# Patient Record
Sex: Female | Born: 1940 | Hispanic: No | Smoking: Former smoker
Health system: Southern US, Community
[De-identification: ages and names within clinical notes are randomized; demographics above are authoritative.]

## PROBLEM LIST (undated history)

## (undated) DIAGNOSIS — J45909 Unspecified asthma, uncomplicated: Secondary | ICD-10-CM

## (undated) DIAGNOSIS — J449 Chronic obstructive pulmonary disease, unspecified: Secondary | ICD-10-CM

## (undated) DIAGNOSIS — E119 Type 2 diabetes mellitus without complications: Secondary | ICD-10-CM

## (undated) HISTORY — DX: Unspecified asthma, uncomplicated: J45.909

## (undated) HISTORY — PX: CHOLECYSTECTOMY: SHX55

## (undated) HISTORY — PX: ABDOMINAL HYSTERECTOMY: SHX81

---

## 2013-01-11 ENCOUNTER — Encounter (HOSPITAL_COMMUNITY): Payer: Self-pay | Admitting: Emergency Medicine

## 2013-01-11 DIAGNOSIS — Z7982 Long term (current) use of aspirin: Secondary | ICD-10-CM

## 2013-01-11 DIAGNOSIS — A0472 Enterocolitis due to Clostridium difficile, not specified as recurrent: Secondary | ICD-10-CM | POA: Diagnosis not present

## 2013-01-11 DIAGNOSIS — J4489 Other specified chronic obstructive pulmonary disease: Secondary | ICD-10-CM | POA: Diagnosis present

## 2013-01-11 DIAGNOSIS — E119 Type 2 diabetes mellitus without complications: Secondary | ICD-10-CM | POA: Diagnosis present

## 2013-01-11 DIAGNOSIS — K420 Umbilical hernia with obstruction, without gangrene: Principal | ICD-10-CM | POA: Diagnosis present

## 2013-01-11 DIAGNOSIS — Z79899 Other long term (current) drug therapy: Secondary | ICD-10-CM

## 2013-01-11 DIAGNOSIS — J449 Chronic obstructive pulmonary disease, unspecified: Secondary | ICD-10-CM | POA: Diagnosis present

## 2013-01-11 DIAGNOSIS — E860 Dehydration: Secondary | ICD-10-CM | POA: Diagnosis present

## 2013-01-11 DIAGNOSIS — Z9089 Acquired absence of other organs: Secondary | ICD-10-CM

## 2013-01-11 DIAGNOSIS — K560 Paralytic ileus: Secondary | ICD-10-CM | POA: Diagnosis not present

## 2013-01-11 DIAGNOSIS — Z87891 Personal history of nicotine dependence: Secondary | ICD-10-CM

## 2013-01-11 DIAGNOSIS — I10 Essential (primary) hypertension: Secondary | ICD-10-CM | POA: Diagnosis present

## 2013-01-11 LAB — LIPASE, BLOOD: Lipase: 16 U/L (ref 11–59)

## 2013-01-11 LAB — COMPREHENSIVE METABOLIC PANEL
Albumin: 4.2 g/dL (ref 3.5–5.2)
Alkaline Phosphatase: 117 U/L (ref 39–117)
BUN: 35 mg/dL — ABNORMAL HIGH (ref 6–23)
CO2: 24 mEq/L (ref 19–32)
Chloride: 95 mEq/L — ABNORMAL LOW (ref 96–112)
GFR calc Af Amer: >90 mL/min (ref >90)
Glucose, Bld: 191 mg/dL — ABNORMAL HIGH (ref 70–99)
Potassium: 4.7 mEq/L (ref 3.7–5.3)
Total Bilirubin: 0.7 mg/dL (ref 0.3–1.2)

## 2013-01-11 LAB — CBC WITH DIFFERENTIAL/PLATELET
Hemoglobin: 15.5 g/dL — ABNORMAL HIGH (ref 12.0–15.0)
Lymphocytes Relative: 6 % — ABNORMAL LOW (ref 12–46)
Lymphs Abs: 1.3 10*3/uL (ref 0.7–4.0)
Monocytes Relative: 7 % (ref 3–12)
Neutro Abs: 18.6 10*3/uL — ABNORMAL HIGH (ref 1.7–7.7)
Neutrophils Relative %: 87 % — ABNORMAL HIGH (ref 43–77)
RBC: 5.5 MIL/uL — ABNORMAL HIGH (ref 3.87–5.11)
WBC: 21.4 10*3/uL — ABNORMAL HIGH (ref 4.0–10.5)

## 2013-01-11 MED ORDER — OXYCODONE-ACETAMINOPHEN 5-325 MG PO TABS
1.0000 | ORAL_TABLET | Freq: Once | ORAL | Status: AC
  Administered 2013-01-11: 1 via ORAL
  Filled 2013-01-11: qty 1

## 2013-01-11 MED ORDER — ONDANSETRON 4 MG PO TBDP
8.0000 mg | ORAL_TABLET | Freq: Once | ORAL | Status: AC
  Administered 2013-01-11: 8 mg via ORAL
  Filled 2013-01-11: qty 2

## 2013-01-11 NOTE — ED Notes (Signed)
Pt states she has been having ABD pain since Monday.  Pt states she has tried OTC medications without success.

## 2013-01-12 ENCOUNTER — Inpatient Hospital Stay (HOSPITAL_COMMUNITY)
Admission: EM | Admit: 2013-01-12 | Discharge: 2013-01-21 | DRG: 354 | Disposition: A | Discharge status: Home / Self Care | Payer: Medicare Other | Attending: Surgery | Admitting: Surgery

## 2013-01-12 ENCOUNTER — Emergency Department (HOSPITAL_COMMUNITY): Payer: Medicare Other

## 2013-01-12 ENCOUNTER — Encounter (HOSPITAL_COMMUNITY): Payer: Self-pay | Admitting: Radiology

## 2013-01-12 DIAGNOSIS — R197 Diarrhea, unspecified: Secondary | ICD-10-CM

## 2013-01-12 DIAGNOSIS — I10 Essential (primary) hypertension: Secondary | ICD-10-CM

## 2013-01-12 DIAGNOSIS — K420 Umbilical hernia with obstruction, without gangrene: Secondary | ICD-10-CM | POA: Diagnosis present

## 2013-01-12 DIAGNOSIS — J4489 Other specified chronic obstructive pulmonary disease: Secondary | ICD-10-CM | POA: Diagnosis present

## 2013-01-12 DIAGNOSIS — A0472 Enterocolitis due to Clostridium difficile, not specified as recurrent: Secondary | ICD-10-CM | POA: Diagnosis present

## 2013-01-12 DIAGNOSIS — J449 Chronic obstructive pulmonary disease, unspecified: Secondary | ICD-10-CM

## 2013-01-12 DIAGNOSIS — E119 Type 2 diabetes mellitus without complications: Secondary | ICD-10-CM | POA: Diagnosis present

## 2013-01-12 HISTORY — DX: Type 2 diabetes mellitus without complications: E11.9

## 2013-01-12 HISTORY — DX: Chronic obstructive pulmonary disease, unspecified: J44.9

## 2013-01-12 LAB — URINALYSIS, ROUTINE W REFLEX MICROSCOPIC
Ketones, ur: 15 mg/dL — AB
Nitrite: NEGATIVE
Specific Gravity, Urine: 1.015 (ref 1.005–1.030)
pH: 5 (ref 5.0–8.0)

## 2013-01-12 LAB — CBC
HCT: 44.6 % (ref 36.0–46.0)
Hemoglobin: 14.5 g/dL (ref 12.0–15.0)
MCH: 27.8 pg (ref 26.0–34.0)
MCHC: 32.5 g/dL (ref 30.0–36.0)
MCV: 85.4 fL (ref 78.0–100.0)

## 2013-01-12 LAB — BASIC METABOLIC PANEL
BUN: 35 mg/dL — ABNORMAL HIGH (ref 6–23)
CO2: 26 mEq/L (ref 19–32)
Chloride: 97 mEq/L (ref 96–112)
Creatinine, Ser: 0.72 mg/dL (ref 0.50–1.10)
GFR calc non Af Amer: 85 mL/min — ABNORMAL LOW (ref >90)
Glucose, Bld: 151 mg/dL — ABNORMAL HIGH (ref 70–99)
Potassium: 4.8 mEq/L (ref 3.7–5.3)

## 2013-01-12 LAB — TSH: TSH: 0.853 u[IU]/mL (ref 0.350–4.500)

## 2013-01-12 LAB — GLUCOSE, CAPILLARY
Glucose-Capillary: 123 mg/dL — ABNORMAL HIGH (ref 70–99)
Glucose-Capillary: 131 mg/dL — ABNORMAL HIGH (ref 70–99)
Glucose-Capillary: 141 mg/dL — ABNORMAL HIGH (ref 70–99)

## 2013-01-12 MED ORDER — LOSARTAN POTASSIUM 25 MG PO TABS: 25.0000 mg | ORAL_TABLET | Freq: Every day | ORAL | Status: DC

## 2013-01-12 MED ORDER — HYDRALAZINE HCL 20 MG/ML IJ SOLN: 10.0000 mg | Freq: Four times a day (QID) | INTRAMUSCULAR | Status: DC | PRN

## 2013-01-12 MED ORDER — SODIUM CHLORIDE 0.9 % IV BOLUS (SEPSIS)
500.0000 mL | Freq: Once | INTRAVENOUS | Status: AC
  Administered 2013-01-12: 500 mL via INTRAVENOUS

## 2013-01-12 MED ORDER — MORPHINE SULFATE 2 MG/ML IJ SOLN
2.0000 mg | Freq: Once | INTRAMUSCULAR | Status: AC
  Administered 2013-01-12: 2 mg via INTRAVENOUS
  Filled 2013-01-12: qty 1

## 2013-01-12 MED ORDER — ALBUTEROL SULFATE HFA 108 (90 BASE) MCG/ACT IN AERS
2.0000 | INHALATION_SPRAY | Freq: Four times a day (QID) | RESPIRATORY_TRACT | Status: DC | PRN
  Filled 2013-01-12: qty 6.7

## 2013-01-12 MED ORDER — PANTOPRAZOLE SODIUM 40 MG PO TBEC: 40.0000 mg | DELAYED_RELEASE_TABLET | Freq: Every day | ORAL | Status: DC

## 2013-01-12 MED ORDER — ONDANSETRON HCL 4 MG/2ML IJ SOLN
4.0000 mg | Freq: Four times a day (QID) | INTRAMUSCULAR | Status: DC | PRN
  Administered 2013-01-17 – 2013-01-21 (×8): 4 mg via INTRAVENOUS
  Filled 2013-01-12 (×8): qty 2

## 2013-01-12 MED ORDER — HYDROMORPHONE HCL PF 1 MG/ML IJ SOLN
0.5000 mg | INTRAMUSCULAR | Status: DC | PRN
  Administered 2013-01-12 (×3): 1 mg via INTRAVENOUS
  Administered 2013-01-13 (×3): 0.5 mg via INTRAVENOUS
  Administered 2013-01-13: 1 mg via INTRAVENOUS
  Administered 2013-01-13: 0.5 mg via INTRAVENOUS
  Administered 2013-01-14 (×4): 1 mg via INTRAVENOUS
  Administered 2013-01-14: 0.5 mg via INTRAVENOUS
  Administered 2013-01-14 – 2013-01-15 (×6): 1 mg via INTRAVENOUS
  Filled 2013-01-12 (×19): qty 1

## 2013-01-12 MED ORDER — INSULIN ASPART 100 UNIT/ML ~~LOC~~ SOLN
0.0000 [IU] | SUBCUTANEOUS | Status: DC
  Administered 2013-01-12: 2 [IU] via SUBCUTANEOUS
  Administered 2013-01-12: 3 [IU] via SUBCUTANEOUS
  Administered 2013-01-12 – 2013-01-20 (×12): 2 [IU] via SUBCUTANEOUS

## 2013-01-12 MED ORDER — SIMVASTATIN 20 MG PO TABS: 20.0000 mg | ORAL_TABLET | Freq: Every day | ORAL | Status: DC

## 2013-01-12 MED ORDER — ENOXAPARIN SODIUM 40 MG/0.4ML ~~LOC~~ SOLN
40.0000 mg | SUBCUTANEOUS | Status: DC
  Administered 2013-01-12 – 2013-01-20 (×9): 40 mg via SUBCUTANEOUS
  Filled 2013-01-12 (×11): qty 0.4

## 2013-01-12 MED ORDER — MONTELUKAST SODIUM 10 MG PO TABS: 10.0000 mg | ORAL_TABLET | Freq: Every day | ORAL | Status: DC

## 2013-01-12 MED ORDER — MOMETASONE FURO-FORMOTEROL FUM 100-5 MCG/ACT IN AERO
2.0000 | INHALATION_SPRAY | Freq: Two times a day (BID) | RESPIRATORY_TRACT | Status: DC
  Administered 2013-01-12 – 2013-01-20 (×16): 2 via RESPIRATORY_TRACT
  Filled 2013-01-12 (×3): qty 8.8

## 2013-01-12 MED ORDER — POTASSIUM CHLORIDE IN NACL 20-0.9 MEQ/L-% IV SOLN
INTRAVENOUS | Status: DC
  Administered 2013-01-12 – 2013-01-17 (×11): via INTRAVENOUS
  Filled 2013-01-12 (×15): qty 1000

## 2013-01-12 MED ORDER — ACETAMINOPHEN 650 MG RE SUPP: 650.0000 mg | Freq: Four times a day (QID) | RECTAL | Status: DC | PRN

## 2013-01-12 MED ORDER — GEMFIBROZIL 600 MG PO TABS: 600.0000 mg | ORAL_TABLET | Freq: Two times a day (BID) | ORAL | Status: DC

## 2013-01-12 MED ORDER — IOHEXOL 300 MG/ML  SOLN
100.0000 mL | Freq: Once | INTRAMUSCULAR | Status: AC | PRN
  Administered 2013-01-12: 100 mL via INTRAVENOUS

## 2013-01-12 MED ORDER — ONDANSETRON HCL 4 MG/2ML IJ SOLN
4.0000 mg | Freq: Once | INTRAMUSCULAR | Status: AC
  Administered 2013-01-12: 4 mg via INTRAVENOUS
  Filled 2013-01-12: qty 2

## 2013-01-12 MED ORDER — CHLORHEXIDINE GLUCONATE 0.12 % MT SOLN
15.0000 mL | Freq: Two times a day (BID) | OROMUCOSAL | Status: DC
  Administered 2013-01-12 – 2013-01-14 (×6): 15 mL via OROMUCOSAL
  Filled 2013-01-12 (×6): qty 15

## 2013-01-12 MED ORDER — IPRATROPIUM-ALBUTEROL 0.5-2.5 (3) MG/3ML IN SOLN: 3.0000 mL | RESPIRATORY_TRACT | Status: DC | PRN

## 2013-01-12 MED ORDER — BIOTENE DRY MOUTH MT LIQD
15.0000 mL | Freq: Two times a day (BID) | OROMUCOSAL | Status: DC
  Administered 2013-01-12 – 2013-01-13 (×3): 15 mL via OROMUCOSAL

## 2013-01-12 MED ORDER — PANTOPRAZOLE SODIUM 40 MG IV SOLR
40.0000 mg | Freq: Every day | INTRAVENOUS | Status: DC
  Administered 2013-01-12 – 2013-01-17 (×6): 40 mg via INTRAVENOUS
  Filled 2013-01-12 (×8): qty 40

## 2013-01-12 MED ORDER — ACETAMINOPHEN 325 MG PO TABS: 650.0000 mg | ORAL_TABLET | Freq: Four times a day (QID) | ORAL | Status: DC | PRN

## 2013-01-12 MED ORDER — IOHEXOL 300 MG/ML  SOLN
25.0000 mL | Freq: Once | INTRAMUSCULAR | Status: AC | PRN
  Administered 2013-01-12: 25 mL via ORAL

## 2013-01-12 MED ORDER — INSULIN ASPART 100 UNIT/ML ~~LOC~~ SOLN: 0.0000 [IU] | SUBCUTANEOUS | Status: DC

## 2013-01-12 NOTE — ED Notes (Signed)
Pt stated she has been at home trying to fight "this" off for a couple days but could no longer take the pain.  "for me to come to the ER it's bad."

## 2013-01-12 NOTE — ED Notes (Signed)
Returned from CT scan.  IV continues to infuse via pump.  Pt left side laying, reports she is comfortable.

## 2013-01-12 NOTE — Consult Note (Signed)
[facility] Medical Consultation  Tonya Houston ZOX:[id number] DOB: May 05, 1940 DOA: 01/12/2013 PCP: Dan Maker, MD   Requesting physician: Dr. Lindie Spruce Date of consultation: 01/12/13 Reason for consultation: Pre-op clearance, diabetes, copd managment  Impression/Recommendations Active Problems:   Incarcerated umbilical hernia   HTN (hypertension)   Chronic airway obstruction, not elsewhere classified   Type II or unspecified type diabetes mellitus without mention of complication, not stated as uncontrolled    1. Diabetes 1. Taking metformin at home, will hold this while inpatient and continue on SSI alone with plans to resume metformin on d/c if stable 2. A1c of 6.3 suggests good glycemic control 3. Currently NPO, would recommend diabetic diet when able to eat 2. HTN 1. BP stable and well controlled 2. Family is unsure of what meds pt is taking 3. Will cont on PRN hydralazine for now 4. Family will bring in pill bottles from home 3. COPD 1. On advair 250/50 bid, will resume 2. Lungs are clear with no wheezing 3. CXR reviewed. Clear and unimpressive 4. Will add PRN duonebs 4. Incarcerated hernia 1. Will defer management to primary surgical service 2. Pre-op EKG was ordered and is still pending 3. No chest pain. No evidence of volume overload. Blood work reviewed and is unremarkable.. 4. If EKG is unremarkable, benefits to surgery would outweigh risks, therefore patient would then be medically cleared for surgery.  I will followup again tomorrow. Please contact me if I can be of assistance in the meanwhile. Thank you for this consultation.  Chief Complaint: Abdominal pain  HPI:  72yo female with a hx of well controlled diabetes, htn, and copd who presents with worsening abdominal pain. She was found to have an incarcerated hernia and subsequently admitted to the surgical service for likely surgical intervention. The hospitalist service was consulted for medical  management of the above as well as medical clearance prior to surgery. Currently, pt denies chest pain or SOB. Denies LE swelling. Only complains of abd pain.  Review of Systems:  Per above, the remainder of the 10pt ros reviewed and are neg.  Past Medical History  Diagnosis Date  . Diabetes mellitus without complication   . COPD (chronic obstructive pulmonary disease)    Past Surgical History  Procedure Laterality Date  . Abdominal hysterectomy    . Cholecystectomy     Social History:  reports that she quit smoking about 21 years ago. She does not have any smokeless tobacco history on file. She reports that she does not drink alcohol or use illicit drugs.  No Known Allergies History reviewed. No pertinent family history.  Prior to Admission medications   Medication Sig Start Date End Date Taking? Authorizing Provider  aspirin EC 81 MG tablet Take 81 mg by mouth daily.   Yes Historical Provider, MD  CALCIUM PO Take 1 tablet by mouth daily.   Yes Historical Provider, MD  IRON PO Take 1 tablet by mouth daily.   Yes Historical Provider, MD   Physical Exam: Blood pressure 116/57, pulse 117, temperature 98.2 F (36.8 C), temperature source Oral, resp. rate 18, height 5' 2.5" (1.588 m), weight 73.483 kg (162 lb), SpO2 83.00%. Filed Vitals:   01/12/13 0445 01/12/13 0515 01/12/13 0530 01/12/13 0829  BP: 142/81 119/65 116/57   Pulse: 120 117 117   Temp:      TempSrc:      Resp:      Height:    5' 2.5" (1.588 m)  Weight:    73.483 kg (162  lb)  SpO2: 95% 79% 83%      General:  Awake, in nad  Eyes: PERRL B  ENT: Membranes moist, dentition fair  Neck: trachea midline, neck supple  Cardiovascular: regular, s1, s2  Respiratory:  Normal resp effort, no wheezing or crackles  Abdomen: soft, nondistended, mildly tender  Skin: normal skin turgor,no abnormal skin lesions seen  Musculoskeletal: perfused, no clubbing  Psychiatric: mood/affect normal // no auditory/visual  hallucinations  Neurologic: cn2-12 grossly intact, strength/sensation intact  Labs on Admission:  Basic Metabolic Panel:  Recent Labs Lab 01/11/13 2310 01/12/13 0501  NA 138 137  K 4.7 4.8  CL 95* 97  CO2 24 26  GLUCOSE 191* 151*  BUN 35* 35*  CREATININE 0.72 0.72  CALCIUM 10.1 9.1   Liver Function Tests:  Recent Labs Lab 01/11/13 2310  AST 22  ALT 19  ALKPHOS 117  BILITOT 0.7  PROT 8.0  ALBUMIN 4.2    Recent Labs Lab 01/11/13 2310  LIPASE 16   No results found for this basename: AMMONIA,  in the last 168 hours CBC:  Recent Labs Lab 01/11/13 2310 01/12/13 0501  WBC 21.4* 9.8  NEUTROABS 18.6*  --   HGB 15.5* 14.5  HCT 46.5* 44.6  MCV 84.5 85.4  PLT 409* 347   Cardiac Enzymes: No results found for this basename: CKTOTAL, CKMB, CKMBINDEX, TROPONINI,  in the last 168 hours BNP: No components found with this basename: POCBNP,  CBG:  Recent Labs Lab 01/12/13 0742 01/12/13 1143  GLUCAP 142* 123*    Radiological Exams on Admission: Ct Abdomen Pelvis W Contrast  01/12/2013   CLINICAL DATA:  Abdominal pain since Monday.  EXAM: CT ABDOMEN AND PELVIS WITH CONTRAST  TECHNIQUE: Multidetector CT imaging of the abdomen and pelvis was performed using the standard protocol following bolus administration of intravenous contrast.  CONTRAST:  OMNIPAQUE IOHEXOL 300 MG/ML  SOLN  COMPARISON:  None.  FINDINGS: Mild scarring in the anterior right lung. Small esophageal hiatal hernia.  Small amount of free fluid around the liver edge. No focal liver lesion. Surgical absence of the gallbladder. The pancreas, spleen, adrenal glands, kidneys, inferior vena cava, and retroperitoneal lymph nodes are unremarkable. Calcification of the aorta without aneurysm. The stomach is not distended. There is small bowel distention with multiple fluid-filled loops. There is an umbilical hernia which contains a loop of small bowel. The small bowel distal to the hernia is decompressed. The  hernia serves as the site of obstruction. No free air or free fluid in the abdomen.  Pelvis: Decompressed stool-filled colon. Scattered diverticula. No diverticulitis. The appendix is normal. No free or loculated pelvic fluid collections. The uterus is likely surgically absent. No abnormal adnexal masses. No destructive bone lesions.  IMPRESSION: Umbilical hernia containing small bowel with small bowel obstruction proximal to the hernia. Small esophageal hiatal hernia. Small amount of free fluid around the liver edge.   Electronically Signed   By: Burman Nieves M.D.   On: 01/12/2013 02:23   Dg Chest Portable 1 View  01/12/2013   CLINICAL DATA:  Abdominal pain.  EXAM: PORTABLE CHEST - 1 VIEW  COMPARISON:  02/13/2012  FINDINGS: The heart size and mediastinal contours are within normal limits. Both lungs are clear. The visualized skeletal structures are unremarkable. No free air under the hemidiaphragms is demonstrated on semi-erect view.  IMPRESSION: No active disease.   Electronically Signed   By: Burman Nieves M.D.   On: 01/12/2013 01:27   Dg Abd Portable  1v  01/12/2013   CLINICAL DATA:  Abdominal pain.  EXAM: PORTABLE ABDOMEN - 1 VIEW  COMPARISON:  None.  FINDINGS: Limited decubitus view is obtained. The nondependent portion of the right abdomen is not included within the field of view. No free air is demonstrated. However, small amount of free air could be missed off the field of view. There is evidence of mild gaseous distention probably of small bowel with multiple air-fluid levels and string of beads sign. Changes suggest dilated fluid-filled small bowel. Obstruction should be excluded.  IMPRESSION: Abnormal bowel gas pattern with suggestion of fluid-filled distended small bowel. Obstruction should be excluded. No evidence of free air although portions of the abdomen are not included within the field of view for evaluation.   Electronically Signed   By: Burman Nieves M.D.   On: 01/12/2013 01:29     EKG:   Time spent:  [name], [name] [facility] Pager [phone]  If 7PM-7AM, please contact night-coverage [email or web address] Password [email or web address] 01/12/2013, 12:02 PM

## 2013-01-12 NOTE — ED Notes (Signed)
Report to RN on 6N

## 2013-01-12 NOTE — ED Notes (Signed)
O2 sat dropping to 88-89 and remaining there.  Placed pt on 2L O2 via [place].

## 2013-01-12 NOTE — ED Provider Notes (Signed)
CSN: [id number]     Arrival date & time 01/11/13  2245 History   First MD Initiated Contact with Patient 01/12/13 0033     Chief Complaint  Patient presents with  . Abdominal Pain    Patient is a 72 y.o. female presenting with abdominal pain. The history is provided by the patient.  Abdominal Pain Pain location:  Generalized Pain quality: aching and bloating   Pain severity:  Severe Onset quality:  Gradual Duration:  2 days Timing:  Constant Progression:  Worsening Chronicity:  New Relieved by:  Nothing Worsened by:  Movement and palpation Associated symptoms: constipation, nausea and vomiting   Associated symptoms: no chest pain, no diarrhea and no fever   pt reports she has had abd pain for two days with associated nausea/vomitng and constipation She has never had this before She reports it is worsening   Past Medical History  Diagnosis Date  . Diabetes mellitus without complication    Past Surgical History  Procedure Laterality Date  . Abdominal hysterectomy    . Cholecystectomy     No family history on file. History  Substance Use Topics  . Smoking status: Former Smoker    Quit date: 01/14/1992  . Smokeless tobacco: Not on file  . Alcohol Use: No   OB History   Grav Para Term Preterm Abortions TAB SAB Ect Mult Living                 Review of Systems  Constitutional: Negative for fever.  Cardiovascular: Negative for chest pain.  Gastrointestinal: Positive for nausea, vomiting, abdominal pain and constipation. Negative for diarrhea.  All other systems reviewed and are negative.    Allergies  Review of patient's allergies indicates no known allergies.  Home Medications   Current Outpatient Rx  Name  Route  Sig  Dispense  Refill  . aspirin EC 81 MG tablet   Oral   Take 81 mg by mouth daily.         Marland Kitchen CALCIUM PO   Oral   Take 1 tablet by mouth daily.         . IRON PO   Oral   Take 1 tablet by mouth daily.          BP 157/84  Pulse  126  Temp(Src) 97.4 F (36.3 C) (Oral)  Resp 16  Wt 162 lb (73.483 kg)  SpO2 91% Physical Exam CONSTITUTIONAL: Well developed/well nourished, uncomfortable appearing HEAD: Normocephalic/atraumatic EYES: EOMI/PERRL ENMT: Mucous membranes moist NECK: supple no meningeal signs CV: S1/S2 noted, no murmurs/rubs/gallops noted LUNGS: Lungs are clear to auscultation bilaterally, no apparent distress ABDOMEN: soft, decreased BS noted, distended, diffuse tenderness noted, umbilical hernia noted with mild tenderness but most of her tenderness is superior to hernia.   NEURO: Pt is awake/alert, moves all extremitiesx4 EXTREMITIES: pulses normal, full ROM SKIN: warm, color normal PSYCH: no abnormalities of mood noted  ED Course  Procedures (including critical care time) 1:35 AM Suspect bowel obstruction Plain films suggestive of bowel obstruction but no free air Will get Ct imaging 3:17 AM Pt found to have SBO by CT imaging D/w surgeon dr wyatt, will see patient Pt updated on plan  Labs Review Labs Reviewed  CBC WITH DIFFERENTIAL - Abnormal; Notable for the following:    WBC 21.4 (*)    RBC 5.50 (*)    Hemoglobin 15.5 (*)    HCT 46.5 (*)    Platelets 409 (*)    Neutrophils Relative % 87 (*)  Neutro Abs 18.6 (*)    Lymphocytes Relative 6 (*)    Monocytes Absolute 1.5 (*)    All other components within normal limits  COMPREHENSIVE METABOLIC PANEL - Abnormal; Notable for the following:    Chloride 95 (*)    Glucose, Bld 191 (*)    BUN 35 (*)    GFR calc non Af Amer 85 (*)    All other components within normal limits  LIPASE, BLOOD  URINALYSIS, ROUTINE W REFLEX MICROSCOPIC   Imaging Review Dg Chest Portable 1 View  01/12/2013   CLINICAL DATA:  Abdominal pain.  EXAM: PORTABLE CHEST - 1 VIEW  COMPARISON:  02/13/2012  FINDINGS: The heart size and mediastinal contours are within normal limits. Both lungs are clear. The visualized skeletal structures are unremarkable. No free air  under the hemidiaphragms is demonstrated on semi-erect view.  IMPRESSION: No active disease.   Electronically Signed   By: Burman Nieves M.D.   On: 01/12/2013 01:27   Dg Abd Portable 1v  01/12/2013   CLINICAL DATA:  Abdominal pain.  EXAM: PORTABLE ABDOMEN - 1 VIEW  COMPARISON:  None.  FINDINGS: Limited decubitus view is obtained. The nondependent portion of the right abdomen is not included within the field of view. No free air is demonstrated. However, small amount of free air could be missed off the field of view. There is evidence of mild gaseous distention probably of small bowel with multiple air-fluid levels and string of beads sign. Changes suggest dilated fluid-filled small bowel. Obstruction should be excluded.  IMPRESSION: Abnormal bowel gas pattern with suggestion of fluid-filled distended small bowel. Obstruction should be excluded. No evidence of free air although portions of the abdomen are not included within the field of view for evaluation.   Electronically Signed   By: Burman Nieves M.D.   On: 01/12/2013 01:29    EKG Interpretation   None       MDM  No diagnosis found. Nursing notes including past medical history and social history reviewed and considered in documentation xrays reviewed and considered Labs/vital reviewed and considered     Joya Gaskins, MD 01/12/13 [phone]

## 2013-01-12 NOTE — H&P (Signed)
Tonya Houston is an 72 y.o. female.   Chief Complaint: Abdominal pain, nausea and vomiting HPI: Problems began on Saturday, acute onset of abdominal pain followed by profuse nausea and vomiting.  CT scan in the ED showed incarcerated umbilical hernia with proximal SBO, this has happened to the patient before by her report, but no prior recommendations for surgery.  Past Medical History  Diagnosis Date  . Diabetes mellitus without complication     Past Surgical History  Procedure Laterality Date  . Abdominal hysterectomy    . Cholecystectomy      No family history on file. Social History:  reports that she quit smoking about 21 years ago. She does not have any smokeless tobacco history on file. She reports that she does not drink alcohol or use illicit drugs.  Allergies: No Known Allergies   (Not in a hospital admission)  Results for orders placed during the hospital encounter of 01/12/13 (from the past 48 hour(s))  CBC WITH DIFFERENTIAL     Status: Abnormal   Collection Time    01/11/13 11:10 PM      Result Value Range   WBC 21.4 (*) 4.0 - 10.5 K/uL   RBC 5.50 (*) 3.87 - 5.11 MIL/uL   Hemoglobin 15.5 (*) 12.0 - 15.0 g/dL   HCT 16.1 (*) 09.6 - 04.5 %   MCV 84.5  78.0 - 100.0 fL   MCH 28.2  26.0 - 34.0 pg   MCHC 33.3  30.0 - 36.0 g/dL   RDW 40.9  81.1 - 91.4 %   Platelets 409 (*) 150 - 400 K/uL   Neutrophils Relative % 87 (*) 43 - 77 %   Neutro Abs 18.6 (*) 1.7 - 7.7 K/uL   Lymphocytes Relative 6 (*) 12 - 46 %   Lymphs Abs 1.3  0.7 - 4.0 K/uL   Monocytes Relative 7  3 - 12 %   Monocytes Absolute 1.5 (*) 0.1 - 1.0 K/uL   Eosinophils Relative 0  0 - 5 %   Eosinophils Absolute 0.0  0.0 - 0.7 K/uL   Basophils Relative 0  0 - 1 %   Basophils Absolute 0.0  0.0 - 0.1 K/uL  COMPREHENSIVE METABOLIC PANEL     Status: Abnormal   Collection Time    01/11/13 11:10 PM      Result Value Range   Sodium 138  137 - 147 mEq/L   Comment: Please note change in reference range.   Potassium 4.7  3.7 - 5.3 mEq/L   Comment: Please note change in reference range.   Chloride 95 (*) 96 - 112 mEq/L   CO2 24  19 - 32 mEq/L   Glucose, Bld 191 (*) 70 - 99 mg/dL   BUN 35 (*) 6 - 23 mg/dL   Creatinine, Ser 7.82  0.50 - 1.10 mg/dL   Calcium 95.6  8.4 - 21.3 mg/dL   Total Protein 8.0  6.0 - 8.3 g/dL   Albumin 4.2  3.5 - 5.2 g/dL   AST 22  0 - 37 U/L   ALT 19  0 - 35 U/L   Alkaline Phosphatase 117  39 - 117 U/L   Total Bilirubin 0.7  0.3 - 1.2 mg/dL   GFR calc non Af Amer 85 (*) >90 mL/min   GFR calc Af Amer >90  >90 mL/min   Comment: (NOTE)     The eGFR has been calculated using the CKD EPI equation.     This calculation has not  been validated in all clinical situations.     eGFR's persistently <90 mL/min signify possible Chronic Kidney     Disease.  LIPASE, BLOOD     Status: None   Collection Time    01/11/13 11:10 PM      Result Value Range   Lipase 16  11 - 59 U/L  URINALYSIS, ROUTINE W REFLEX MICROSCOPIC     Status: Abnormal   Collection Time    01/12/13  3:03 AM      Result Value Range   Color, Urine AMBER (*) YELLOW   Comment: BIOCHEMICALS MAY BE AFFECTED BY COLOR   APPearance CLEAR  CLEAR   Specific Gravity, Urine 1.015  1.005 - 1.030   pH 5.0  5.0 - 8.0   Glucose, UA NEGATIVE  NEGATIVE mg/dL   Hgb urine dipstick NEGATIVE  NEGATIVE   Bilirubin Urine SMALL (*) NEGATIVE   Ketones, ur 15 (*) NEGATIVE mg/dL   Protein, ur NEGATIVE  NEGATIVE mg/dL   Urobilinogen, UA 0.2  0.0 - 1.0 mg/dL   Nitrite NEGATIVE  NEGATIVE   Leukocytes, UA NEGATIVE  NEGATIVE   Comment: MICROSCOPIC NOT DONE ON URINES WITH NEGATIVE PROTEIN, BLOOD, LEUKOCYTES, NITRITE, OR GLUCOSE <1000 mg/dL.   Ct Abdomen Pelvis W Contrast  01/12/2013   CLINICAL DATA:  Abdominal pain since Monday.  EXAM: CT ABDOMEN AND PELVIS WITH CONTRAST  TECHNIQUE: Multidetector CT imaging of the abdomen and pelvis was performed using the standard protocol following bolus administration of intravenous contrast.   CONTRAST:  OMNIPAQUE IOHEXOL 300 MG/ML  SOLN  COMPARISON:  None.  FINDINGS: Mild scarring in the anterior right lung. Small esophageal hiatal hernia.  Small amount of free fluid around the liver edge. No focal liver lesion. Surgical absence of the gallbladder. The pancreas, spleen, adrenal glands, kidneys, inferior vena cava, and retroperitoneal lymph nodes are unremarkable. Calcification of the aorta without aneurysm. The stomach is not distended. There is small bowel distention with multiple fluid-filled loops. There is an umbilical hernia which contains a loop of small bowel. The small bowel distal to the hernia is decompressed. The hernia serves as the site of obstruction. No free air or free fluid in the abdomen.  Pelvis: Decompressed stool-filled colon. Scattered diverticula. No diverticulitis. The appendix is normal. No free or loculated pelvic fluid collections. The uterus is likely surgically absent. No abnormal adnexal masses. No destructive bone lesions.  IMPRESSION: Umbilical hernia containing small bowel with small bowel obstruction proximal to the hernia. Small esophageal hiatal hernia. Small amount of free fluid around the liver edge.   Electronically Signed   By: Burman Nieves M.D.   On: 01/12/2013 02:23   Dg Chest Portable 1 View  01/12/2013   CLINICAL DATA:  Abdominal pain.  EXAM: PORTABLE CHEST - 1 VIEW  COMPARISON:  02/13/2012  FINDINGS: The heart size and mediastinal contours are within normal limits. Both lungs are clear. The visualized skeletal structures are unremarkable. No free air under the hemidiaphragms is demonstrated on semi-erect view.  IMPRESSION: No active disease.   Electronically Signed   By: Burman Nieves M.D.   On: 01/12/2013 01:27   Dg Abd Portable 1v  01/12/2013   CLINICAL DATA:  Abdominal pain.  EXAM: PORTABLE ABDOMEN - 1 VIEW  COMPARISON:  None.  FINDINGS: Limited decubitus view is obtained. The nondependent portion of the right abdomen is not included  within the field of view. No free air is demonstrated. However, small amount of free air could be missed  off the field of view. There is evidence of mild gaseous distention probably of small bowel with multiple air-fluid levels and string of beads sign. Changes suggest dilated fluid-filled small bowel. Obstruction should be excluded.  IMPRESSION: Abnormal bowel gas pattern with suggestion of fluid-filled distended small bowel. Obstruction should be excluded. No evidence of free air although portions of the abdomen are not included within the field of view for evaluation.   Electronically Signed   By: Burman Nieves M.D.   On: 01/12/2013 01:29    Review of Systems  Constitutional: Negative.   Gastrointestinal: Positive for nausea, vomiting and abdominal pain.  All other systems reviewed and are negative.    Blood pressure 131/69, pulse 106, temperature 98.2 F (36.8 C), temperature source Oral, resp. rate 18, weight 73.483 kg (162 lb), SpO2 98.00%. Physical Exam  Vitals reviewed. Constitutional: She is oriented to person, place, and time. She appears well-nourished.  Obese  HENT:  Head: Normocephalic and atraumatic.  Eyes: Conjunctivae and EOM are normal.  Neck: Normal range of motion. Neck supple.  Cardiovascular: Normal rate, regular rhythm and normal heart sounds.   Respiratory: Effort normal and breath sounds normal.  GI: Soft. Bowel sounds are normal. There is tenderness (periumbilical tenderness until reduced) in the periumbilical area. A hernia is present. Hernia confirmed positive in the ventral area.    Musculoskeletal: Normal range of motion.  Neurological: She is alert and oriented to person, place, and time.  Skin: Skin is warm and dry.  Psychiatric: She has a normal mood and affect. Her behavior is normal. Judgment and thought content normal.     Assessment/Plan Previously incarcerated umbilical hernia--reduced in the ED with minimal difficulty SBO related to  incarecerated hernia Leukocytosis, likely related to demargination from significant dehydration secondary to vomiting and obstruction--no peritonitis  Admit the patient after reduction of the hernia, will likely need surgery to fix hernia and examine the bowel that was incarcerated. NGT to LCS No antibiotics needed at this time, but will need some preoperatively  Tamarra Geiselman O 01/12/2013, 4:55 AM

## 2013-01-13 LAB — GLUCOSE, CAPILLARY
GLUCOSE-CAPILLARY: 117 mg/dL — AB (ref 70–99)
GLUCOSE-CAPILLARY: 86 mg/dL (ref 70–99)
Glucose-Capillary: 128 mg/dL — ABNORMAL HIGH (ref 70–99)
Glucose-Capillary: 101 mg/dL — ABNORMAL HIGH (ref 70–99)
Glucose-Capillary: 96 mg/dL (ref 70–99)

## 2013-01-13 LAB — SURGICAL PCR SCREEN
MRSA, PCR: NEGATIVE
STAPHYLOCOCCUS AUREUS: NEGATIVE

## 2013-01-13 NOTE — Progress Notes (Signed)
[facility] PROGRESS NOTE  Tonya Houston WUJ:[id number] DOB: 1940-02-25 DOA: 01/12/2013 PCP: Dan Maker, MD  Assessment/Plan: 1.Diabetes mellitus -Okay blood glucose control today -Continue sliding scale insulin while patient is n.p.o. 2.HTN (hypertension) -BP controlled this a.m., continue to monitor off meds at this time and further treat accordingly  3.Chronic airway obstruction, not elsewhere classified -Stable, continue bronchodilators  4.Incarcerated umbilical hernia -Per surgery  Code Status: full Family Communication: none at bedside Disposition Plan: per primary    Procedures:  none  Antibiotics:  none  HPI/Subjective: Patient complaining of increased abdominal pain  Objective: Filed Vitals:   01/13/13 0646  BP: 118/44  Pulse: 89  Temp: 98.2 F (36.8 C)  Resp: 16    Intake/Output Summary (Last 24 hours) at 01/13/13 1105 Last data filed at 01/13/13 0600  Gross per 24 hour  Intake   2060 ml  Output    450 ml  Net   1610 ml   Filed Weights   01/11/13 2251 01/12/13 0700 01/12/13 0829  Weight: 73.483 kg (162 lb) 74 kg (163 lb 2.3 oz) 73.483 kg (162 lb)    Exam:  General: alert & oriented x 3 In NAD NG in place Cardiovascular: RRR, nl S1 s2 Respiratory: Clear, no crackles or wheezes Abdomen: soft decreased BS +tenderness  no masses palpable Extremities: No cyanosis and no edema    Data Reviewed: Basic Metabolic Panel:  Recent Labs Lab 01/11/13 2310 01/12/13 0501  NA 138 137  K 4.7 4.8  CL 95* 97  CO2 24 26  GLUCOSE 191* 151*  BUN 35* 35*  CREATININE 0.72 0.72  CALCIUM 10.1 9.1   Liver Function Tests:  Recent Labs Lab 01/11/13 2310  AST 22  ALT 19  ALKPHOS 117  BILITOT 0.7  PROT 8.0  ALBUMIN 4.2    Recent Labs Lab 01/11/13 2310  LIPASE 16   No results found for this basename: AMMONIA,  in the last 168 hours CBC:  Recent Labs Lab 01/11/13 2310 01/12/13 0501  WBC 21.4* 9.8  NEUTROABS 18.6*  --   HGB  15.5* 14.5  HCT 46.5* 44.6  MCV 84.5 85.4  PLT 409* 347   Cardiac Enzymes: No results found for this basename: CKTOTAL, CKMB, CKMBINDEX, TROPONINI,  in the last 168 hours BNP (last 3 results) No results found for this basename: PROBNP,  in the last 8760 hours CBG:  Recent Labs Lab 01/12/13 1606 01/12/13 2019 01/12/13 2329 01/13/13 0332 01/13/13 0720  GLUCAP 156* 131* 141* 117* 128*    Recent Results (from the past 240 hour(s))  SURGICAL PCR SCREEN     Status: None   Collection Time    01/13/13 12:22 AM      Result Value Range Status   MRSA, PCR NEGATIVE  NEGATIVE Final   Staphylococcus aureus NEGATIVE  NEGATIVE Final   Comment:            The Xpert SA Assay (FDA     approved for NASAL specimens     in patients over 72 years of age),     is one component of     a comprehensive surveillance     program.  Test performance has     been validated by [employer] for patients greater     than or equal to 69 year old.     It is not intended     to diagnose infection nor to     guide or monitor treatment.  Studies: Ct Abdomen Pelvis W Contrast  01/12/2013   CLINICAL DATA:  Abdominal pain since Monday.  EXAM: CT ABDOMEN AND PELVIS WITH CONTRAST  TECHNIQUE: Multidetector CT imaging of the abdomen and pelvis was performed using the standard protocol following bolus administration of intravenous contrast.  CONTRAST:  100mL OMNIPAQUE IOHEXOL 300 MG/ML  SOLN  COMPARISON:  None.  FINDINGS: Mild scarring in the anterior right lung. Small esophageal hiatal hernia.  Small amount of free fluid around the liver edge. No focal liver lesion. Surgical absence of the gallbladder. The pancreas, spleen, adrenal glands, kidneys, inferior vena cava, and retroperitoneal lymph nodes are unremarkable. Calcification of the aorta without aneurysm. The stomach is not distended. There is small bowel distention with multiple fluid-filled loops. There is an umbilical hernia which contains a loop of  small bowel. The small bowel distal to the hernia is decompressed. The hernia serves as the site of obstruction. No free air or free fluid in the abdomen.  Pelvis: Decompressed stool-filled colon. Scattered diverticula. No diverticulitis. The appendix is normal. No free or loculated pelvic fluid collections. The uterus is likely surgically absent. No abnormal adnexal masses. No destructive bone lesions.  IMPRESSION: Umbilical hernia containing small bowel with small bowel obstruction proximal to the hernia. Small esophageal hiatal hernia. Small amount of free fluid around the liver edge.   Electronically Signed   By: Burman NievesWilliam  Stevens M.D.   On: 01/12/2013 02:23   Dg Chest Portable 1 View  01/12/2013   CLINICAL DATA:  Abdominal pain.  EXAM: PORTABLE CHEST - 1 VIEW  COMPARISON:  02/13/2012  FINDINGS: The heart size and mediastinal contours are within normal limits. Both lungs are clear. The visualized skeletal structures are unremarkable. No free air under the hemidiaphragms is demonstrated on semi-erect view.  IMPRESSION: No active disease.   Electronically Signed   By: Burman NievesWilliam  Stevens M.D.   On: 01/12/2013 01:27   Dg Abd Portable 1v  01/12/2013   CLINICAL DATA:  Abdominal pain.  EXAM: PORTABLE ABDOMEN - 1 VIEW  COMPARISON:  None.  FINDINGS: Limited decubitus view is obtained. The nondependent portion of the right abdomen is not included within the field of view. No free air is demonstrated. However, small amount of free air could be missed off the field of view. There is evidence of mild gaseous distention probably of small bowel with multiple air-fluid levels and string of beads sign. Changes suggest dilated fluid-filled small bowel. Obstruction should be excluded.  IMPRESSION: Abnormal bowel gas pattern with suggestion of fluid-filled distended small bowel. Obstruction should be excluded. No evidence of free air although portions of the abdomen are not included within the field of view for evaluation.    Electronically Signed   By: Burman NievesWilliam  Stevens M.D.   On: 01/12/2013 01:29    Scheduled Meds: . antiseptic oral rinse  15 mL Mouth Rinse q12n4p  . chlorhexidine  15 mL Mouth Rinse BID  . enoxaparin (LOVENOX) injection  40 mg Subcutaneous Q24H  . insulin aspart  0-15 Units Subcutaneous Q4H  . mometasone-formoterol  2 puff Inhalation BID  . pantoprazole (PROTONIX) IV  40 mg Intravenous QHS   Continuous Infusions: . 0.9 % NaCl with KCl 20 mEq / L 100 mL/hr at 01/13/13 16100623    Active Problems:   Incarcerated umbilical hernia   HTN (hypertension)   Chronic airway obstruction, not elsewhere classified   Type II or unspecified type diabetes mellitus without mention of complication, not stated as uncontrolled    Time  spent: 25    [facility]  [facility] Pager [phone]. If 7PM-7AM, please contact night-coverage at [email or web address], password [facility] 01/13/2013, 11:05 AM  LOS: 1 day

## 2013-01-13 NOTE — Progress Notes (Signed)
General Surgery Note  LOS: 1 day  POD -     Assessment/Plan: 1.  Incarcerated umbilical hernia - now reduced  Has NGT and has had a lot of BM's.  Will plan repair of hernia, but probably not today because of holiday and backed up OR schedule. 2.  Diabetes mellitus  Glucose - 128 - 01/13/2013 3.  HTN 4.  COPD 5.  DVT prophylaxis - Lovenox   Active Problems:   Incarcerated umbilical hernia   HTN (hypertension)   Chronic airway obstruction, not elsewhere classified   Type II or unspecified type diabetes mellitus without mention of complication, not stated as uncontrolled   Subjective:  Doing okay.  No severe pain, but NGT bothering her. Objective:   Filed Vitals:   01/13/13 0646  BP: 118/44  Pulse: 89  Temp: 98.2 F (36.8 C)  Resp: 16     Intake/Output from previous day:  12/31 0701 - 01/01 0700 In: 2060 [I.V.:2060] Out: 450 [Emesis/NG output:450]  Intake/Output this shift:      Physical Exam:   General: [name] who is alert and oriented.  Has NGT.   HEENT: Normal. Pupils equal. .   Lungs: Clear.   Abdomen: Very large, though she says she has a large abdomen.  No localized tenderness.  BS present.  Umbilical hernia reduced.   Lab Results:    Recent Labs  01/11/13 2310 01/12/13 0501  WBC 21.4* 9.8  HGB 15.5* 14.5  HCT 46.5* 44.6  PLT 409* 347    BMET   Recent Labs  01/11/13 2310 01/12/13 0501  NA 138 137  K 4.7 4.8  CL 95* 97  CO2 24 26  GLUCOSE 191* 151*  BUN 35* 35*  CREATININE 0.72 0.72  CALCIUM 10.1 9.1    PT/INR  No results found for this basename: LABPROT, INR,  in the last 72 hours  ABG  No results found for this basename: PHART, PCO2, PO2, HCO3,  in the last 72 hours   Studies/Results:  Ct Abdomen Pelvis W Contrast  01/12/2013   CLINICAL DATA:  Abdominal pain since Monday.  EXAM: CT ABDOMEN AND PELVIS WITH CONTRAST  TECHNIQUE: Multidetector CT imaging of the abdomen and pelvis was performed using the standard protocol following  bolus administration of intravenous contrast.  CONTRAST:  OMNIPAQUE IOHEXOL 300 MG/ML  SOLN  COMPARISON:  None.  FINDINGS: Mild scarring in the anterior right lung. Small esophageal hiatal hernia.  Small amount of free fluid around the liver edge. No focal liver lesion. Surgical absence of the gallbladder. The pancreas, spleen, adrenal glands, kidneys, inferior vena cava, and retroperitoneal lymph nodes are unremarkable. Calcification of the aorta without aneurysm. The stomach is not distended. There is small bowel distention with multiple fluid-filled loops. There is an umbilical hernia which contains a loop of small bowel. The small bowel distal to the hernia is decompressed. The hernia serves as the site of obstruction. No free air or free fluid in the abdomen.  Pelvis: Decompressed stool-filled colon. Scattered diverticula. No diverticulitis. The appendix is normal. No free or loculated pelvic fluid collections. The uterus is likely surgically absent. No abnormal adnexal masses. No destructive bone lesions.  IMPRESSION: Umbilical hernia containing small bowel with small bowel obstruction proximal to the hernia. Small esophageal hiatal hernia. Small amount of free fluid around the liver edge.   Electronically Signed   By: Burman Nieves M.D.   On: 01/12/2013 02:23   Dg Chest Portable 1 View  01/12/2013  CLINICAL DATA:  Abdominal pain.  EXAM: PORTABLE CHEST - 1 VIEW  COMPARISON:  02/13/2012  FINDINGS: The heart size and mediastinal contours are within normal limits. Both lungs are clear. The visualized skeletal structures are unremarkable. No free air under the hemidiaphragms is demonstrated on semi-erect view.  IMPRESSION: No active disease.   Electronically Signed   By: Burman NievesWilliam  Stevens M.D.   On: 01/12/2013 01:27   Dg Abd Portable 1v  01/12/2013   CLINICAL DATA:  Abdominal pain.  EXAM: PORTABLE ABDOMEN - 1 VIEW  COMPARISON:  None.  FINDINGS: Limited decubitus view is obtained. The nondependent  portion of the right abdomen is not included within the field of view. No free air is demonstrated. However, small amount of free air could be missed off the field of view. There is evidence of mild gaseous distention probably of small bowel with multiple air-fluid levels and string of beads sign. Changes suggest dilated fluid-filled small bowel. Obstruction should be excluded.  IMPRESSION: Abnormal bowel gas pattern with suggestion of fluid-filled distended small bowel. Obstruction should be excluded. No evidence of free air although portions of the abdomen are not included within the field of view for evaluation.   Electronically Signed   By: Burman NievesWilliam  Stevens M.D.   On: 01/12/2013 01:29     Anti-infectives:   Anti-infectives   None      Ovidio Kinavid Elizar Alpern, MD, FACS Pager: [phone] [facility] Office: [phone] 01/13/2013

## 2013-01-14 ENCOUNTER — Inpatient Hospital Stay (HOSPITAL_COMMUNITY): Payer: Medicare Other | Admitting: Certified Registered Nurse Anesthetist

## 2013-01-14 ENCOUNTER — Encounter (HOSPITAL_COMMUNITY): Payer: Medicare Other | Admitting: Certified Registered Nurse Anesthetist

## 2013-01-14 ENCOUNTER — Encounter (HOSPITAL_COMMUNITY): Admission: EM | Disposition: A | Discharge status: Home / Self Care | Payer: Self-pay | Source: Home / Self Care

## 2013-01-14 HISTORY — PX: UMBILICAL HERNIA REPAIR: SHX196

## 2013-01-14 LAB — GLUCOSE, CAPILLARY
GLUCOSE-CAPILLARY: 78 mg/dL (ref 70–99)
GLUCOSE-CAPILLARY: 84 mg/dL (ref 70–99)
GLUCOSE-CAPILLARY: 76 mg/dL (ref 70–99)
GLUCOSE-CAPILLARY: 83 mg/dL (ref 70–99)
Glucose-Capillary: 83 mg/dL (ref 70–99)
Glucose-Capillary: 88 mg/dL (ref 70–99)
Glucose-Capillary: 95 mg/dL (ref 70–99)

## 2013-01-14 SURGERY — REPAIR, HERNIA, UMBILICAL, ADULT
Anesthesia: General | Site: Abdomen

## 2013-01-14 SURGERY — Surgical Case
Anesthesia: *Unknown

## 2013-01-14 MED ORDER — LACTATED RINGERS IV SOLN
INTRAVENOUS | Status: DC | PRN
  Administered 2013-01-14 (×2): via INTRAVENOUS

## 2013-01-14 MED ORDER — ROCURONIUM BROMIDE 100 MG/10ML IV SOLN
INTRAVENOUS | Status: DC | PRN
  Administered 2013-01-14: 20 mg via INTRAVENOUS

## 2013-01-14 MED ORDER — MEPERIDINE HCL 25 MG/ML IJ SOLN: 6.2500 mg | INTRAMUSCULAR | Status: DC | PRN

## 2013-01-14 MED ORDER — HYDROMORPHONE HCL PF 1 MG/ML IJ SOLN
0.2500 mg | INTRAMUSCULAR | Status: DC | PRN
  Administered 2013-01-14: 0.5 mg via INTRAVENOUS

## 2013-01-14 MED ORDER — SODIUM CHLORIDE 0.9 % IJ SOLN
INTRAMUSCULAR | Status: AC
  Filled 2013-01-14: qty 3

## 2013-01-14 MED ORDER — LACTATED RINGERS IV SOLN
INTRAVENOUS | Status: DC
  Administered 2013-01-14: 09:00:00 via INTRAVENOUS

## 2013-01-14 MED ORDER — OXYCODONE HCL 5 MG PO TABS: 5.0000 mg | ORAL_TABLET | Freq: Once | ORAL | Status: DC | PRN

## 2013-01-14 MED ORDER — LIDOCAINE HCL (CARDIAC) 20 MG/ML IV SOLN
INTRAVENOUS | Status: DC | PRN
  Administered 2013-01-14: 100 mg via INTRAVENOUS

## 2013-01-14 MED ORDER — BUPIVACAINE-EPINEPHRINE 0.5% -1:200000 IJ SOLN
INTRAMUSCULAR | Status: DC | PRN
  Administered 2013-01-14: 30 mL

## 2013-01-14 MED ORDER — SUCCINYLCHOLINE CHLORIDE 20 MG/ML IJ SOLN
INTRAMUSCULAR | Status: DC | PRN
  Administered 2013-01-14: 120 mg via INTRAVENOUS

## 2013-01-14 MED ORDER — HYDROMORPHONE HCL PF 1 MG/ML IJ SOLN
INTRAMUSCULAR | Status: AC
  Filled 2013-01-14: qty 1

## 2013-01-14 MED ORDER — CEFAZOLIN SODIUM-DEXTROSE 2-3 GM-% IV SOLR
2.0000 g | INTRAVENOUS | Status: AC
  Filled 2013-01-14 (×2): qty 50

## 2013-01-14 MED ORDER — GLYCOPYRROLATE 0.2 MG/ML IJ SOLN
INTRAMUSCULAR | Status: DC | PRN
  Administered 2013-01-14: 0.6 mg via INTRAVENOUS

## 2013-01-14 MED ORDER — BUPIVACAINE-EPINEPHRINE (PF) 0.25% -1:200000 IJ SOLN
INTRAMUSCULAR | Status: AC
  Filled 2013-01-14: qty 30

## 2013-01-14 MED ORDER — FENTANYL CITRATE 0.05 MG/ML IJ SOLN
INTRAMUSCULAR | Status: DC | PRN
  Administered 2013-01-14: 150 ug via INTRAVENOUS

## 2013-01-14 MED ORDER — MIDAZOLAM HCL 5 MG/5ML IJ SOLN
INTRAMUSCULAR | Status: DC | PRN
  Administered 2013-01-14: 2 mg via INTRAVENOUS

## 2013-01-14 MED ORDER — ONDANSETRON HCL 4 MG/2ML IJ SOLN
INTRAMUSCULAR | Status: DC | PRN
  Administered 2013-01-14: 4 mg via INTRAVENOUS

## 2013-01-14 MED ORDER — OXYCODONE HCL 5 MG/5ML PO SOLN: 5.0000 mg | Freq: Once | ORAL | Status: DC | PRN

## 2013-01-14 MED ORDER — PROPOFOL 10 MG/ML IV BOLUS
INTRAVENOUS | Status: DC | PRN
  Administered 2013-01-14: 120 mg via INTRAVENOUS

## 2013-01-14 MED ORDER — 0.9 % SODIUM CHLORIDE (POUR BTL) OPTIME
TOPICAL | Status: DC | PRN
  Administered 2013-01-14: 1000 mL

## 2013-01-14 MED ORDER — ONDANSETRON HCL 4 MG/2ML IJ SOLN: 4.0000 mg | Freq: Once | INTRAMUSCULAR | Status: DC | PRN

## 2013-01-14 MED ORDER — NEOSTIGMINE METHYLSULFATE 1 MG/ML IJ SOLN
INTRAMUSCULAR | Status: DC | PRN
  Administered 2013-01-14: 4 mg via INTRAVENOUS

## 2013-01-14 SURGICAL SUPPLY — 52 items
APL SKNCLS STERI-STRIP NONHPOA (GAUZE/BANDAGES/DRESSINGS) ×1
BENZOIN TINCTURE PRP APPL 2/3 (GAUZE/BANDAGES/DRESSINGS) ×3 IMPLANT
BLADE SURG 15 STRL LF DISP TIS (BLADE) ×1 IMPLANT
BLADE SURG 15 STRL SS (BLADE) ×3
BLADE SURG ROTATE 9660 (MISCELLANEOUS) IMPLANT
CANISTER SUCTION 2500CC (MISCELLANEOUS) IMPLANT
CHLORAPREP W/TINT 26ML (MISCELLANEOUS) ×3 IMPLANT
CLOSURE WOUND 1/2 X4 (GAUZE/BANDAGES/DRESSINGS) ×1
COVER SURGICAL LIGHT HANDLE (MISCELLANEOUS) ×3 IMPLANT
DRAPE PED LAPAROTOMY (DRAPES) ×3 IMPLANT
DRAPE UTILITY 15X26 W/TAPE STR (DRAPE) ×6 IMPLANT
DRSG TEGADERM 4X4.75 (GAUZE/BANDAGES/DRESSINGS) ×2 IMPLANT
ELECT CAUTERY BLADE 6.4 (BLADE) ×3 IMPLANT
ELECT REM PT RETURN 9FT ADLT (ELECTROSURGICAL) ×3
ELECTRODE REM PT RTRN 9FT ADLT (ELECTROSURGICAL) ×1 IMPLANT
GAUZE SPONGE 2X2 8PLY STRL LF (GAUZE/BANDAGES/DRESSINGS) IMPLANT
GAUZE SPONGE 4X4 16PLY XRAY LF (GAUZE/BANDAGES/DRESSINGS) ×3 IMPLANT
GLOVE BIO SURGEON STRL SZ7 (GLOVE) ×9 IMPLANT
GLOVE BIOGEL PI IND STRL 6.5 (GLOVE) ×1 IMPLANT
GLOVE BIOGEL PI IND STRL 7.0 (GLOVE) IMPLANT
GLOVE BIOGEL PI IND STRL 7.5 (GLOVE) ×1 IMPLANT
GLOVE BIOGEL PI INDICATOR 6.5 (GLOVE) ×2
GLOVE BIOGEL PI INDICATOR 7.0 (GLOVE) ×4
GLOVE BIOGEL PI INDICATOR 7.5 (GLOVE) ×4
GLOVE SKINSENSE NS SZ6.5 (GLOVE) ×2
GLOVE SKINSENSE STRL SZ6.5 (GLOVE) IMPLANT
GOWN STRL NON-REIN LRG LVL3 (GOWN DISPOSABLE) ×10 IMPLANT
KIT BASIN OR (CUSTOM PROCEDURE TRAY) ×3 IMPLANT
KIT ROOM TURNOVER OR (KITS) ×3 IMPLANT
NDL HYPO 25GX1X1/2 BEV (NEEDLE) ×1 IMPLANT
NEEDLE HYPO 25GX1X1/2 BEV (NEEDLE) ×3 IMPLANT
NS IRRIG 1000ML POUR BTL (IV SOLUTION) ×3 IMPLANT
PACK SURGICAL SETUP 50X90 (CUSTOM PROCEDURE TRAY) ×3 IMPLANT
PAD ARMBOARD 7.5X6 YLW CONV (MISCELLANEOUS) ×6 IMPLANT
PENCIL BUTTON HOLSTER BLD 10FT (ELECTRODE) ×3 IMPLANT
SPONGE GAUZE 2X2 STER 10/PKG (GAUZE/BANDAGES/DRESSINGS) ×2
SPONGE GAUZE 4X4 12PLY (GAUZE/BANDAGES/DRESSINGS) ×3 IMPLANT
SPONGE LAP 18X18 X RAY DECT (DISPOSABLE) ×3 IMPLANT
STRIP CLOSURE SKIN 1/2X4 (GAUZE/BANDAGES/DRESSINGS) ×2 IMPLANT
SUCTION POOLE TIP (SUCTIONS) ×2 IMPLANT
SUT MNCRL AB 4-0 PS2 18 (SUTURE) ×3 IMPLANT
SUT NOVA NAB GS-21 0 18 T12 DT (SUTURE) ×6 IMPLANT
SUT VIC AB 3-0 SH 27 (SUTURE) ×3
SUT VIC AB 3-0 SH 27X BRD (SUTURE) ×1 IMPLANT
SYR BULB 3OZ (MISCELLANEOUS) ×3 IMPLANT
SYR CONTROL 10ML LL (SYRINGE) ×3 IMPLANT
TOWEL OR 17X24 6PK STRL BLUE (TOWEL DISPOSABLE) ×3 IMPLANT
TOWEL OR 17X26 10 PK STRL BLUE (TOWEL DISPOSABLE) ×3 IMPLANT
TUBE CONNECTING 12'X1/4 (SUCTIONS)
TUBE CONNECTING 12X1/4 (SUCTIONS) IMPLANT
WATER STERILE IRR 1000ML POUR (IV SOLUTION) IMPLANT
YANKAUER SUCT BULB TIP NO VENT (SUCTIONS) ×3 IMPLANT

## 2013-01-14 NOTE — Progress Notes (Signed)
  Subjective: Still slightly sore at umbilicus Having bowel movements - NG out No nausea  Objective: Vital signs in last 24 hours: Temp:  [98.2 F (36.8 C)-98.3 F (36.8 C)] 98.3 F (36.8 C) (01/02 0635) Pulse Rate:  [91-100] 91 (01/02 0635) Resp:  [17-20] 20 (01/02 0635) BP: (103-122)/(49-61) 103/49 mmHg (01/02 0635) SpO2:  [97 %-99 %] 98 % (01/02 0635) Last BM Date: 01/13/13  Intake/Output from previous day: 01/01 0701 - 01/02 0700 In: 1860 [P.O.:360; I.V.:1500] Out: -  Intake/Output this shift:    General appearance: alert, cooperative and no distress Umbilical hernia - reduced  Lab Results:   Recent Labs  01/11/13 2310 01/12/13 0501  WBC 21.4* 9.8  HGB 15.5* 14.5  HCT 46.5* 44.6  PLT 409* 347   BMET  Recent Labs  01/11/13 2310 01/12/13 0501  NA 138 137  K 4.7 4.8  CL 95* 97  CO2 24 26  GLUCOSE 191* 151*  BUN 35* 35*  CREATININE 0.72 0.72  CALCIUM 10.1 9.1   PT/INR No results found for this basename: LABPROT, INR,  in the last 72 hours ABG No results found for this basename: PHART, PCO2, PO2, HCO3,  in the last 72 hours  Studies/Results: No results found.  Anti-infectives: Anti-infectives   None      Assessment/Plan: s/p Procedure(s): OPEN REPAIR UMBILICAL HERNIA  (N/A) Surgery today to repair umbilical hernia The surgical procedure has been discussed with the patient.  Potential risks, benefits, alternative treatments, and expected outcomes have been explained.  All of the patient's questions at this time have been answered.  The likelihood of reaching the patient's treatment goal is good.  The patient understand the proposed surgical procedure and wishes to proceed.   LOS: 2 days    Tonya Houston K. 01/14/2013

## 2013-01-14 NOTE — Transfer of Care (Signed)
Immediate Anesthesia Transfer of Care Note  Patient: Nicky PughJeanette Andreasen  Procedure(s) Performed: Procedure(s): OPEN REPAIR UMBILICAL HERNIA  (N/A)  Patient Location: PACU  Anesthesia Type:General  Level of Consciousness: awake and alert   Airway & Oxygen Therapy: Patient Spontanous Breathing and Patient connected to nasal cannula oxygen  Post-op Assessment: Report given to PACU RN and Post -op Vital signs reviewed and stable  Post vital signs: Reviewed and stable  Complications: No apparent anesthesia complications

## 2013-01-14 NOTE — Op Note (Signed)
Indications:  The patient presented with a history of an incarcerated umbilical hernia with small bowel obstruction.  The patient was examined and we recommended umbilical hernia repair.  The hernia was manually reduced but she remains mildly tender around her umbilicus.  She has had a previous laparoscopic surgery through this area.  Pre-operative diagnosis:  Umbilical hernia  Post-operative diagnosis:  Same  Surgeon: [name]   Assistants: none  Anesthesia: General endotracheal anesthesia  ASA Class: 2   Procedure Details  The patient was seen again in the Holding Room. The risks, benefits, complications, treatment options, and expected outcomes were discussed with the patient. The possibilities of reaction to medication, pulmonary aspiration, perforation of viscus, bleeding, recurrent infection, the need for additional procedures, and development of a complication requiring transfusion or further operation were discussed with the patient and/or family. There was concurrence with the proposed plan, and informed consent was obtained. The site of surgery was properly noted/marked. The patient was taken to the Operating Room, [facility], and the procedure verified as umbilical hernia repair. A Time Out was held and the above information confirmed.  After an adequate level of general anesthesia was obtained, the patient's abdomen was prepped with Chloraprep and draped in sterile fashion.  Her abdomen is quite protuberant, but she had told me that she always appears this way.  We made a transverse incision above the umbilicus.  Dissection was carried down to the hernia sac with cautery.  We dissected bluntly around the hernia sac down to the edge of the fascial defect.  The hernia sac extends down below the umbilicus. We excised the hernia sac back into the fascial edges.  The fascial defect measured 1.5 cm.  We cleared the fascia in all directions.   The fascial defect was  closed with multiple interrupted figure-of-eight 0 Novofil sutures.  The base of the umbilicus was tacked down with 3-0 Vicryl.  3-0 Vicryl was used to close the subcutaneous tissues and 4-0 Monocryl was used to close the skin.  Steri-strips and clean dressing were applied.  The patient was extubated and brought to the recovery room in stable condition.  All sponge, instrument, and needle counts were correct prior to closure and at the conclusion of the case.   Estimated Blood Loss: Minimal          Complications: None; patient tolerated the procedure well.         Disposition: PACU - hemodynamically stable.         Condition: stable  Wilmon ArmsMatthew K. Corliss Skainssuei, MD, [facility]  General/ Trauma Surgery  01/14/2013 9:41 AM

## 2013-01-14 NOTE — Anesthesia Preprocedure Evaluation (Addendum)
Anesthesia Evaluation  Patient identified by MRN, date of birth, ID band Patient awake    Reviewed: Allergy & Precautions, H&P , NPO status , Patient's Chart, lab work & pertinent test results, reviewed documented beta blocker date and time   Airway Mallampati: I TM Distance: >3 FB Neck ROM: Full  Mouth opening: Limited Mouth Opening  Dental  (+) Edentulous Lower and Edentulous Upper   Pulmonary COPD COPD inhaler, former smoker,  breath sounds clear to auscultation        Cardiovascular hypertension, Pt. on medications     Neuro/Psych    GI/Hepatic   Endo/Other  diabetes, Well Controlled, Type 2, Oral Hypoglycemic Agents  Renal/GU      Musculoskeletal   Abdominal   Peds  Hematology   Anesthesia Other Findings   Reproductive/Obstetrics                          Anesthesia Physical Anesthesia Plan  ASA: II  Anesthesia Plan: General   Post-op Pain Management:    Induction: Intravenous  Airway Management Planned: Oral ETT  Additional Equipment:   Intra-op Plan:   Post-operative Plan: Extubation in OR  Informed Consent: I have reviewed the patients History and Physical, chart, labs and discussed the procedure including the risks, benefits and alternatives for the proposed anesthesia with the patient or authorized representative who has indicated his/her understanding and acceptance.   Dental advisory given  Plan Discussed with: CRNA and Surgeon  Anesthesia Plan Comments:        Anesthesia Quick Evaluation

## 2013-01-14 NOTE — Anesthesia Procedure Notes (Signed)
Procedure Name: Intubation Date/Time: 01/14/2013 8:59 AM Performed by: Reine JustFLOWERS, Jeffrey Voth T Pre-anesthesia Checklist: Patient identified, Emergency Drugs available, Suction available, Patient being monitored and Timeout performed Patient Re-evaluated:Patient Re-evaluated prior to inductionOxygen Delivery Method: Circle system utilized and Simple face mask Preoxygenation: Pre-oxygenation with 100% oxygen Intubation Type: IV induction Ventilation: Mask ventilation without difficulty Laryngoscope Size: Miller and 2 Grade View: Grade I Tube type: Oral Tube size: 7.0 mm Number of attempts: 1 Airway Equipment and Method: Patient positioned with wedge pillow and Stylet Placement Confirmation: ETT inserted through vocal cords under direct vision,  positive ETCO2 and breath sounds checked- equal and bilateral Secured at: 22 cm Tube secured with: Tape Dental Injury: Teeth and Oropharynx as per pre-operative assessment

## 2013-01-14 NOTE — Anesthesia Postprocedure Evaluation (Signed)
Anesthesia Post Note  Patient: Tonya PughJeanette Houston  Procedure(s) Performed: Procedure(s) (LRB): OPEN REPAIR UMBILICAL HERNIA  (N/A)  Anesthesia type: general  Patient location: PACU  Post pain: Pain level controlled  Post assessment: Patient's Cardiovascular Status Stable  Last Vitals:  Filed Vitals:   01/14/13 1035  BP: 138/78  Pulse: 88  Temp:   Resp: 17    Post vital signs: Reviewed and stable  Level of consciousness: sedated  Complications: No apparent anesthesia complications

## 2013-01-14 NOTE — Progress Notes (Signed)
[facility] PROGRESS NOTE  Tonya Houston ION:[id number] DOB: 02/02/1940 DOA: 01/12/2013 PCP: Dan MakerR,RICHARD L, MD  Assessment/Plan: 1.Diabetes mellitus -good blood glucose control today -Continue sliding scale insulin while patient follow and resume outpt meds once tolerating by mouth well. 2.HTN (hypertension) -BP controlled this a.m., continue to monitor off meds at this time and further treat accordingly  3.Chronic airway obstruction, not elsewhere classified -Stable, continue bronchodilators  4.Incarcerated umbilical hernia -s/p surgery, per her primary Code Status: full Family Communication: Family at bedside Disposition Plan: per primary    Procedures:  none  Antibiotics:  Cefazolin per surgery  HPI/Subjective: s/p surgery -states she feels better, requesting a diet  Objective: Filed Vitals:   01/14/13 1157  BP: 120/74  Pulse: 79  Temp: 97.5 F (36.4 C)  Resp: 19    Intake/Output Summary (Last 24 hours) at 01/14/13 1250 Last data filed at 01/14/13 0954  Gross per 24 hour  Intake   2860 ml  Output      0 ml  Net   2860 ml   Filed Weights   01/11/13 2251 01/12/13 0700 01/12/13 0829  Weight: 73.483 kg (162 lb) 74 kg (163 lb 2.3 oz) 73.483 kg (162 lb)    Exam:  General: alert & oriented x 3 In NAD NG in place Cardiovascular: RRR, nl S1 s2 Respiratory: Clear, no crackles or wheezes Abdomen: +BS,ND, dressing clean and dry  no masses palpable Extremities: No cyanosis and no edema    Data Reviewed: Basic Metabolic Panel:  Recent Labs Lab 01/11/13 2310 01/12/13 0501  NA 138 137  K 4.7 4.8  CL 95* 97  CO2 24 26  GLUCOSE 191* 151*  BUN 35* 35*  CREATININE 0.72 0.72  CALCIUM 10.1 9.1   Liver Function Tests:  Recent Labs Lab 01/11/13 2310  AST 22  ALT 19  ALKPHOS 117  BILITOT 0.7  PROT 8.0  ALBUMIN 4.2    Recent Labs Lab 01/11/13 2310  LIPASE 16   No results found for this basename: AMMONIA,  in the last 168  hours CBC:  Recent Labs Lab 01/11/13 2310 01/12/13 0501  WBC 21.4* 9.8  NEUTROABS 18.6*  --   HGB 15.5* 14.5  HCT 46.5* 44.6  MCV 84.5 85.4  PLT 409* 347   Cardiac Enzymes: No results found for this basename: CKTOTAL, CKMB, CKMBINDEX, TROPONINI,  in the last 168 hours BNP (last 3 results) No results found for this basename: PROBNP,  in the last 8760 hours CBG:  Recent Labs Lab 01/14/13 0009 01/14/13 0402 01/14/13 0731 01/14/13 1004 01/14/13 1155  GLUCAP 88 78 83 84 76    Recent Results (from the past 240 hour(s))  SURGICAL PCR SCREEN     Status: None   Collection Time    01/13/13 12:22 AM      Result Value Range Status   MRSA, PCR NEGATIVE  NEGATIVE Final   Staphylococcus aureus NEGATIVE  NEGATIVE Final   Comment:            The Xpert SA Assay (FDA     approved for NASAL specimens     in patients over 71 years of age),     is one component of     a comprehensive surveillance     program.  Test performance has     been validated by [facility] for patients greater     than or equal to 71 year old.     It is not intended  to diagnose infection nor to     guide or monitor treatment.     Studies: No results found.  Scheduled Meds: . antiseptic oral rinse  15 mL Mouth Rinse q12n4p  .  ceFAZolin (ANCEF) IV  2 g Intravenous On Call  . chlorhexidine  15 mL Mouth Rinse BID  . enoxaparin (LOVENOX) injection  40 mg Subcutaneous Q24H  . HYDROmorphone      . insulin aspart  0-15 Units Subcutaneous Q4H  . mometasone-formoterol  2 puff Inhalation BID  . pantoprazole (PROTONIX) IV  40 mg Intravenous QHS  . sodium chloride       Continuous Infusions: . 0.9 % NaCl with KCl 20 mEq / L 100 mL/hr at 01/14/13 1157  . lactated ringers 10 mL/hr at 01/14/13 4098    Active Problems:   Incarcerated umbilical hernia   HTN (hypertension)   Chronic airway obstruction, not elsewhere classified   Type II or unspecified type diabetes mellitus without mention of  complication, not stated as uncontrolled    Time spent: 61    [facility]  [facility] Pager [phone]. If 7PM-7AM, please contact night-coverage at [email or web address], password [facility] 01/14/2013, 12:50 PM  LOS: 2 days

## 2013-01-14 NOTE — Preoperative (Signed)
Beta Blockers   Reason not to administer Beta Blockers:Not Applicable 

## 2013-01-15 ENCOUNTER — Inpatient Hospital Stay (HOSPITAL_COMMUNITY): Payer: Medicare Other

## 2013-01-15 DIAGNOSIS — R197 Diarrhea, unspecified: Secondary | ICD-10-CM

## 2013-01-15 LAB — CBC
HCT: 36 % (ref 36.0–46.0)
Hemoglobin: 11.3 g/dL — ABNORMAL LOW (ref 12.0–15.0)
MCH: 26.8 pg (ref 26.0–34.0)
MCHC: 31.4 g/dL (ref 30.0–36.0)
MCV: 85.5 fL (ref 78.0–100.0)
PLATELETS: 272 10*3/uL (ref 150–400)
RBC: 4.21 MIL/uL (ref 3.87–5.11)
RDW: 13.5 % (ref 11.5–15.5)
WBC: 3.9 10*3/uL — AB (ref 4.0–10.5)

## 2013-01-15 LAB — BASIC METABOLIC PANEL
BUN: 14 mg/dL (ref 6–23)
CHLORIDE: 101 meq/L (ref 96–112)
CO2: 27 mEq/L (ref 19–32)
Calcium: 8.5 mg/dL (ref 8.4–10.5)
Creatinine, Ser: 0.53 mg/dL (ref 0.50–1.10)
GFR calc non Af Amer: >90 mL/min (ref >90)
Glucose, Bld: 93 mg/dL (ref 70–99)
POTASSIUM: 4.4 meq/L (ref 3.7–5.3)
Sodium: 136 mEq/L — ABNORMAL LOW (ref 137–147)

## 2013-01-15 LAB — GLUCOSE, CAPILLARY
GLUCOSE-CAPILLARY: 86 mg/dL (ref 70–99)
GLUCOSE-CAPILLARY: 80 mg/dL (ref 70–99)
Glucose-Capillary: 76 mg/dL (ref 70–99)
Glucose-Capillary: 84 mg/dL (ref 70–99)
Glucose-Capillary: 87 mg/dL (ref 70–99)
Glucose-Capillary: 88 mg/dL (ref 70–99)

## 2013-01-15 LAB — CLOSTRIDIUM DIFFICILE BY PCR: CDIFFPCR: POSITIVE — AB

## 2013-01-15 MED ORDER — HYDROMORPHONE HCL PF 1 MG/ML IJ SOLN
0.5000 mg | INTRAMUSCULAR | Status: DC | PRN
  Administered 2013-01-15 – 2013-01-19 (×5): 0.5 mg via INTRAVENOUS
  Filled 2013-01-15 (×5): qty 1

## 2013-01-15 MED ORDER — HYDROCODONE-ACETAMINOPHEN 5-325 MG PO TABS
1.0000 | ORAL_TABLET | ORAL | Status: DC | PRN
  Administered 2013-01-16 – 2013-01-20 (×10): 2 via ORAL
  Administered 2013-01-20: 1 via ORAL
  Filled 2013-01-15 (×6): qty 2
  Filled 2013-01-15: qty 1
  Filled 2013-01-15 (×4): qty 2

## 2013-01-15 MED ORDER — METRONIDAZOLE 500 MG PO TABS
500.0000 mg | ORAL_TABLET | Freq: Three times a day (TID) | ORAL | Status: DC
  Administered 2013-01-15 – 2013-01-17 (×5): 500 mg via ORAL
  Filled 2013-01-15 (×8): qty 1

## 2013-01-15 NOTE — Progress Notes (Signed)
c-diff pcr is positive, pt placed on Enteric isolation. MD informed.

## 2013-01-15 NOTE — Progress Notes (Signed)
Xray report called to Dr. Dwain SarnaWakefield, keep on clears, call MD if pt becomes n/v.

## 2013-01-15 NOTE — Progress Notes (Addendum)
[facility] PROGRESS NOTE  Tonya Houston RUE:[id number] DOB: 08/21/1940 DOA: 01/12/2013 PCP: Dan MakerR,RICHARD L, MD  Assessment/Plan: 1.Diabetes mellitus -good blood glucose control  -Continue sliding scale insulin while patient follow and resume outpt meds once tolerating by mouth well. 2.HTN (hypertension) -BP controlled this a.m., continue to monitor off meds at this time and further treat accordingly  3.Chronic airway obstruction, not elsewhere classified -Stable, continue bronchodilators  4.Incarcerated umbilical hernia -s/p surgery, per her primary 5. Diarrhea -Agree with C. difficile studies, follow and treat as appropriate pending results Code Status: full Family Communication: updated Family at bedside Disposition Plan: per primary    Procedures:  none  Antibiotics:  Cefazolin per surgery  HPI/Subjective: Pt gone down for x-ray, per family patient more bloated and with diarrhea overnight.    Objective: Filed Vitals:   01/15/13 0509  BP: 107/68  Pulse:   Temp: 97.6 F (36.4 C)  Resp: 20    Intake/Output Summary (Last 24 hours) at 01/15/13 1344 Last data filed at 01/15/13 0900  Gross per 24 hour  Intake   2120 ml  Output      0 ml  Net   2120 ml   Filed Weights   01/11/13 2251 01/12/13 0700 01/12/13 0829  Weight: 73.483 kg (162 lb) 74 kg (163 lb 2.3 oz) 73.483 kg (162 lb)     Data Reviewed: Basic Metabolic Panel:  Recent Labs Lab 01/11/13 2310 01/12/13 0501 01/15/13 1210  NA 138 137 136*  K 4.7 4.8 4.4  CL 95* 97 101  CO2 24 26 27   GLUCOSE 191* 151* 93  BUN 35* 35* 14  CREATININE 0.72 0.72 0.53  CALCIUM 10.1 9.1 8.5   Liver Function Tests:  Recent Labs Lab 01/11/13 2310  AST 22  ALT 19  ALKPHOS 117  BILITOT 0.7  PROT 8.0  ALBUMIN 4.2    Recent Labs Lab 01/11/13 2310  LIPASE 16   No results found for this basename: AMMONIA,  in the last 168 hours CBC:  Recent Labs Lab 01/11/13 2310 01/12/13 0501 01/15/13 1210   WBC 21.4* 9.8 3.9*  NEUTROABS 18.6*  --   --   HGB 15.5* 14.5 11.3*  HCT 46.5* 44.6 36.0  MCV 84.5 85.4 85.5  PLT 409* 347 272   Cardiac Enzymes: No results found for this basename: CKTOTAL, CKMB, CKMBINDEX, TROPONINI,  in the last 168 hours BNP (last 3 results) No results found for this basename: PROBNP,  in the last 8760 hours CBG:  Recent Labs Lab 01/14/13 2019 01/15/13 0031 01/15/13 0431 01/15/13 0728 01/15/13 1209  GLUCAP 95 84 87 86 88    Recent Results (from the past 240 hour(s))  SURGICAL PCR SCREEN     Status: None   Collection Time    01/13/13 12:22 AM      Result Value Range Status   MRSA, PCR NEGATIVE  NEGATIVE Final   Staphylococcus aureus NEGATIVE  NEGATIVE Final   Comment:            The Xpert SA Assay (FDA     approved for NASAL specimens     in patients over 71 years of age),     is one component of     a comprehensive surveillance     program.  Test performance has     been validated by [facility] for patients greater     than or equal to 727 year old.     It is not intended  to diagnose infection nor to     guide or monitor treatment.     Studies: No results found.  Scheduled Meds: . enoxaparin (LOVENOX) injection  40 mg Subcutaneous Q24H  . insulin aspart  0-15 Units Subcutaneous Q4H  . mometasone-formoterol  2 puff Inhalation BID  . pantoprazole (PROTONIX) IV  40 mg Intravenous QHS   Continuous Infusions: . 0.9 % NaCl with KCl 20 mEq / L 100 mL/hr at 01/15/13 1245  . lactated ringers 10 mL/hr at 01/14/13 1610    Active Problems:   Incarcerated umbilical hernia   HTN (hypertension)   Chronic airway obstruction, not elsewhere classified   Type II or unspecified type diabetes mellitus without mention of complication, not stated as uncontrolled       Tonya Houston C  [facility] Pager [phone]. If 7PM-7AM, please contact night-coverage at [email or web address], password [facility] 01/15/2013, 1:44 PM  LOS: 3 days

## 2013-01-15 NOTE — Progress Notes (Signed)
1 Day Post-Op  Subjective: Stable and alert. Daughter inroom. Lying in bed. Denies nausea or vomiting. Tolerating clear liquids fair. Not ambulating.. Says she is having bowel movements.  Objective: Vital signs in last 24 hours: Temp:  [97.5 F (36.4 C)-100.1 F (37.8 C)] 97.6 F (36.4 C) (01/03 0509) Pulse Rate:  [79-106] 106 (01/03 0132) Resp:  [13-20] 20 (01/03 0509) BP: (105-123)/(54-74) 107/68 mmHg (01/03 0509) SpO2:  [91 %-97 %] 95 % (01/03 0509) Last BM Date: 01/15/13  Intake/Output from previous day: 01/02 0701 - 01/03 0700 In: 2880 [P.O.:480; I.V.:2400] Out: -  Intake/Output this shift: Total I/O In: 240 [P.O.:240] Out: -   General appearance: alert. Oriented. Minimal distress. deconditioned.  GI: abdomen is distended.. Tympanitic.. Mild diffuse tenderness but basically soft. Umbilical wound looks good.  Lab Results:  Results for orders placed during the hospital encounter of 01/12/13 (from the past 24 hour(s))  GLUCOSE, CAPILLARY     Status: None   Collection Time    01/14/13 11:55 AM      Result Value Range   Glucose-Capillary 76  70 - 99 mg/dL  GLUCOSE, CAPILLARY     Status: None   Collection Time    01/14/13  3:56 PM      Result Value Range   Glucose-Capillary 83  70 - 99 mg/dL  GLUCOSE, CAPILLARY     Status: None   Collection Time    01/14/13  8:19 PM      Result Value Range   Glucose-Capillary 95  70 - 99 mg/dL   Comment 1 Notify RN    GLUCOSE, CAPILLARY     Status: None   Collection Time    01/15/13 12:31 AM      Result Value Range   Glucose-Capillary 84  70 - 99 mg/dL   Comment 1 Notify RN    GLUCOSE, CAPILLARY     Status: None   Collection Time    01/15/13  4:31 AM      Result Value Range   Glucose-Capillary 87  70 - 99 mg/dL   Comment 1 Notify RN    GLUCOSE, CAPILLARY     Status: None   Collection Time    01/15/13  7:28 AM      Result Value Range   Glucose-Capillary 86  70 - 99 mg/dL     Studies/Results: @RISRSLT24 @  . enoxaparin  (LOVENOX) injection  40 mg Subcutaneous Q24H  . insulin aspart  0-15 Units Subcutaneous Q4H  . mometasone-formoterol  2 puff Inhalation BID  . pantoprazole (PROTONIX) IV  40 mg Intravenous QHS     Assessment/Plan: s/p Procedure(s): OPEN REPAIR UMBILICAL HERNIA   POD #1. Primary repair incarcerated umbilical hernia. Stable surgically. No wound problems.  Abdominal distention. The degree of her distention is of some concern, and I do not feel that it is safe to send her home today. We'll check lab work and abdominal x-rays Since patient's says she is hungry, will allow diet as tolerated Check C. Diff.  Diabetes mellitus. Glucose 86 this morning.  Hypertension  COPD  DVT prophylaxis, Lovenox.  @PROBHOSP @  LOS: 3 days    Tonya Houston M 01/15/2013  . .prob

## 2013-01-16 LAB — GLUCOSE, CAPILLARY
Glucose-Capillary: 102 mg/dL — ABNORMAL HIGH (ref 70–99)
Glucose-Capillary: 135 mg/dL — ABNORMAL HIGH (ref 70–99)
Glucose-Capillary: 67 mg/dL — ABNORMAL LOW (ref 70–99)
Glucose-Capillary: 91 mg/dL (ref 70–99)
Glucose-Capillary: 91 mg/dL (ref 70–99)
Glucose-Capillary: 99 mg/dL (ref 70–99)

## 2013-01-16 NOTE — Progress Notes (Addendum)
2 Days Post-Op  Subjective:  C.Diff PCR is positive. Started on Flagyl and placed on enteric isolation.  Abd. -rays show some distended small bowel, consistent with ileus or SBO.  She's had 4 or 5 loose stools in the last 24 hours. Intermittent nausea but no vomiting. No abdominal pain at rest but she is tender when palpated.  Objective: Vital signs in last 24 hours: Temp:  [97.5 F (36.4 C)-97.7 F (36.5 C)] 97.5 F (36.4 C) (01/04 [id number]) Pulse Rate:  [87-98] 87 (01/04 0638) Resp:  [19-20] 19 (01/04 0638) BP: (114-116)/(49-55) 115/49 mmHg (01/04 0638) SpO2:  [92 %-97 %] 93 % (01/04 1020) Last BM Date: 01/16/13  Intake/Output from previous day: 01/03 0701 - 01/04 0700 In: 3197 [P.O.:720; I.V.:2477] Out: 1 [Stool:1] Intake/Output this shift: Total I/O In: 400 [P.O.:400] Out: -    EXAM: General appearance: alert and cooperative. Doesn't really seem to be in any significant distress. Mental status normal. GI: abdomen clearly distended and tympanitic. Bowel sounds present. Mild diffuse tenderness.  but no peritoneal signs. Hernia repair appears intact. Wound looks okay.  Lab Results:  Results for orders placed during the hospital encounter of 01/12/13 (from the past 24 hour(s))  GLUCOSE, CAPILLARY     Status: None   Collection Time    01/15/13 12:09 PM      Result Value Range   Glucose-Capillary 88  70 - 99 mg/dL  CBC     Status: Abnormal   Collection Time    01/15/13 12:10 PM      Result Value Range   WBC 3.9 (*) 4.0 - 10.5 K/uL   RBC 4.21  3.87 - 5.11 MIL/uL   Hemoglobin 11.3 (*) 12.0 - 15.0 g/dL   HCT 11.936.0  14.736.0 - 82.946.0 %   MCV 85.5  78.0 - 100.0 fL   MCH 26.8  26.0 - 34.0 pg   MCHC 31.4  30.0 - 36.0 g/dL   RDW 56.213.5  13.011.5 - 86.515.5 %   Platelets 272  150 - 400 K/uL  BASIC METABOLIC PANEL     Status: Abnormal   Collection Time    01/15/13 12:10 PM      Result Value Range   Sodium 136 (*) 137 - 147 mEq/L   Potassium 4.4  3.7 - 5.3 mEq/L   Chloride 101  96 - 112  mEq/L   CO2 27  19 - 32 mEq/L   Glucose, Bld 93  70 - 99 mg/dL   BUN 14  6 - 23 mg/dL   Creatinine, Ser 7.840.53  0.50 - 1.10 mg/dL   Calcium 8.5  8.4 - 69.610.5 mg/dL   GFR calc non Af Amer >90  >90 mL/min   GFR calc Af Amer >90  >90 mL/min  CLOSTRIDIUM DIFFICILE BY PCR     Status: Abnormal   Collection Time    01/15/13  2:45 PM      Result Value Range   C difficile by pcr POSITIVE (*) NEGATIVE  GLUCOSE, CAPILLARY     Status: None   Collection Time    01/15/13  3:54 PM      Result Value Range   Glucose-Capillary 80  70 - 99 mg/dL   Comment 1 Notify RN    GLUCOSE, CAPILLARY     Status: None   Collection Time    01/15/13  7:54 PM      Result Value Range   Glucose-Capillary 76  70 - 99 mg/dL   Comment 1 Notify RN  GLUCOSE, CAPILLARY     Status: Abnormal   Collection Time    01/16/13 12:13 AM      Result Value Range   Glucose-Capillary 67 (*) 70 - 99 mg/dL   Comment 1 Notify RN     Comment 2 Documented in Chart    GLUCOSE, CAPILLARY     Status: None   Collection Time    01/16/13  4:18 AM      Result Value Range   Glucose-Capillary 91  70 - 99 mg/dL   Comment 1 Notify RN     Comment 2 Documented in Chart    GLUCOSE, CAPILLARY     Status: None   Collection Time    01/16/13  7:29 AM      Result Value Range   Glucose-Capillary 91  70 - 99 mg/dL     Studies/Results: @RISRSLT24 @  . enoxaparin (LOVENOX) injection  40 mg Subcutaneous Q24H  . insulin aspart  0-15 Units Subcutaneous Q4H  . metroNIDAZOLE  500 mg Oral Q8H  . mometasone-formoterol  2 puff Inhalation BID  . pantoprazole (PROTONIX) IV  40 mg Intravenous QHS     Assessment/Plan: s/p Procedure(s): OPEN REPAIR UMBILICAL HERNIA   POD #2. Primary repair incarcerated umbilical hernia. Stable surgically. No wound problems.   Abdominal distention.probably a combination of C. Difficile colitis and postop ileus.I told her to back off on diet and we will continue the Flagyl. .  Diabetes mellitus. Glucose 91 this  morning.  Hypertension  COPD  DVT prophylaxis, Lovenox.   @PROBHOSP @  LOS: 4 days    Galen Russman M 01/16/2013  . .prob

## 2013-01-16 NOTE — Progress Notes (Signed)
[facility] PROGRESS NOTE  Tonya Houston JXB:[id number] DOB: 11/02/1940 DOA: 01/12/2013 PCP: Tonya MakerR,RICHARD L, MD  Assessment/Plan: 1.Diabetes mellitus -good blood glucose control  -Continue sliding scale insulin while patient follow and resume outpt meds once tolerating by mouth well. 2.HTN (hypertension) -BP controlled this a.m., continue to monitor off meds at this time and further treat accordingly  3.Chronic airway obstruction, not elsewhere classified -Stable, continue bronchodilators  4.Incarcerated umbilical hernia -s/p surgery, per her primary 5. Cdiff colitis -Now on flagyl -No leukocytosis -Afebrile Code Status: full Family Communication: updated Family at bedside Disposition Plan: per primary   Procedures:  none  Antibiotics:  Cefazolin per surgery  Flagyl 01/15/13>>>  HPI/Subjective: No acute events noted overnight.  Feels better  Objective: Filed Vitals:   01/16/13 0638  BP: 115/49  Pulse: 87  Temp: 97.5 F (36.4 C)  Resp: 19    Intake/Output Summary (Last 24 hours) at 01/16/13 1116 Last data filed at 01/16/13 0904  Gross per 24 hour  Intake   3357 ml  Output      1 ml  Net   3356 ml   Filed Weights   01/11/13 2251 01/12/13 0700 01/12/13 0829  Weight: 73.483 kg (162 lb) 74 kg (163 lb 2.3 oz) 73.483 kg (162 lb)     Data Reviewed: Basic Metabolic Panel:  Recent Labs Lab 01/11/13 2310 01/12/13 0501 01/15/13 1210  NA 138 137 136*  K 4.7 4.8 4.4  CL 95* 97 101  CO2 24 26 27   GLUCOSE 191* 151* 93  BUN 35* 35* 14  CREATININE 0.72 0.72 0.53  CALCIUM 10.1 9.1 8.5   Liver Function Tests:  Recent Labs Lab 01/11/13 2310  AST 22  ALT 19  ALKPHOS 117  BILITOT 0.7  PROT 8.0  ALBUMIN 4.2    Recent Labs Lab 01/11/13 2310  LIPASE 16   No results found for this basename: AMMONIA,  in the last 168 hours CBC:  Recent Labs Lab 01/11/13 2310 01/12/13 0501 01/15/13 1210  WBC 21.4* 9.8 3.9*  NEUTROABS 18.6*  --   --   HGB  15.5* 14.5 11.3*  HCT 46.5* 44.6 36.0  MCV 84.5 85.4 85.5  PLT 409* 347 272   Cardiac Enzymes: No results found for this basename: CKTOTAL, CKMB, CKMBINDEX, TROPONINI,  in the last 168 hours BNP (last 3 results) No results found for this basename: PROBNP,  in the last 8760 hours CBG:  Recent Labs Lab 01/15/13 1554 01/15/13 1954 01/16/13 0013 01/16/13 0418 01/16/13 0729  GLUCAP 80 76 67* 91 91    Recent Results (from the past 240 hour(s))  SURGICAL PCR SCREEN     Status: None   Collection Time    01/13/13 12:22 AM      Result Value Range Status   MRSA, PCR NEGATIVE  NEGATIVE Final   Staphylococcus aureus NEGATIVE  NEGATIVE Final   Comment:            The Xpert SA Assay (FDA     approved for NASAL specimens     in patients over 71 years of age),     is one component of     a comprehensive surveillance     program.  Test performance has     been validated by [facility] for patients greater     than or equal to 71 year old.     It is not intended     to diagnose infection nor to  guide or monitor treatment.  CLOSTRIDIUM DIFFICILE BY PCR     Status: Abnormal   Collection Time    01/15/13  2:45 PM      Result Value Range Status   C difficile by pcr POSITIVE (*) NEGATIVE Final   Comment: CRITICAL RESULT CALLED TO, READ BACK BY AND VERIFIED WITH:     CAIN,I RN 01/15/13 1745 WOOTEN,K     Studies: Dg Abd 2 Views  01/15/2013   CLINICAL DATA:  Postop abdominal distention with surgery to repair abdominal hernia performed yesterday  EXAM: ABDOMEN - 2 VIEW  COMPARISON:  CT scan 01/12/2013  FINDINGS: There is diffuse small bowel distension of moderate severity to a diameter of 6 cm. There is minimal gas into colon. There are air-fluid levels throughout the small bowel. No free air is identified on decubitus films.  IMPRESSION: Findings consistent with small-bowel obstruction.   Electronically Signed   By: Esperanza Heir M.D.   On: 01/15/2013 13:57    Scheduled Meds: .  enoxaparin (LOVENOX) injection  40 mg Subcutaneous Q24H  . insulin aspart  0-15 Units Subcutaneous Q4H  . metroNIDAZOLE  500 mg Oral Q8H  . mometasone-formoterol  2 puff Inhalation BID  . pantoprazole (PROTONIX) IV  40 mg Intravenous QHS   Continuous Infusions: . 0.9 % NaCl with KCl 20 mEq / [place] 100 mL/hr at 01/16/13 0905  . lactated ringers 10 mL/hr at 01/14/13 1610    Active Problems:   Incarcerated umbilical hernia   HTN (hypertension)   Chronic airway obstruction, not elsewhere classified   Type II or unspecified type diabetes mellitus without mention of complication, not stated as uncontrolled       [name], [name]  [facility] Pager [phone]. If 7PM-7AM, please contact night-coverage at [email or web address], password [facility] 01/16/2013, 11:16 AM  LOS: 4 days

## 2013-01-17 ENCOUNTER — Inpatient Hospital Stay (HOSPITAL_COMMUNITY): Payer: Medicare Other

## 2013-01-17 LAB — BASIC METABOLIC PANEL
BUN: 9 mg/dL (ref 6–23)
CHLORIDE: 103 meq/L (ref 96–112)
CO2: 23 meq/L (ref 19–32)
CREATININE: 0.44 mg/dL — AB (ref 0.50–1.10)
Calcium: 8.5 mg/dL (ref 8.4–10.5)
GFR calc Af Amer: >90 mL/min (ref >90)
GFR calc non Af Amer: >90 mL/min (ref >90)
Glucose, Bld: 93 mg/dL (ref 70–99)
Potassium: 3.8 mEq/L (ref 3.7–5.3)
Sodium: 138 mEq/L (ref 137–147)

## 2013-01-17 LAB — CBC
HCT: 37.1 % (ref 36.0–46.0)
Hemoglobin: 11.8 g/dL — ABNORMAL LOW (ref 12.0–15.0)
MCH: 26.9 pg (ref 26.0–34.0)
MCHC: 31.8 g/dL (ref 30.0–36.0)
MCV: 84.7 fL (ref 78.0–100.0)
Platelets: 341 10*3/uL (ref 150–400)
RBC: 4.38 MIL/uL (ref 3.87–5.11)
RDW: 13.7 % (ref 11.5–15.5)
WBC: 5.3 10*3/uL (ref 4.0–10.5)

## 2013-01-17 LAB — GLUCOSE, CAPILLARY
GLUCOSE-CAPILLARY: 96 mg/dL (ref 70–99)
Glucose-Capillary: 104 mg/dL — ABNORMAL HIGH (ref 70–99)
Glucose-Capillary: 115 mg/dL — ABNORMAL HIGH (ref 70–99)
Glucose-Capillary: 74 mg/dL (ref 70–99)
Glucose-Capillary: 79 mg/dL (ref 70–99)
Glucose-Capillary: 82 mg/dL (ref 70–99)
Glucose-Capillary: 83 mg/dL (ref 70–99)

## 2013-01-17 LAB — MAGNESIUM: MAGNESIUM: 1.5 mg/dL (ref 1.5–2.5)

## 2013-01-17 MED ORDER — METRONIDAZOLE IN NACL 5-0.79 MG/ML-% IV SOLN
500.0000 mg | Freq: Three times a day (TID) | INTRAVENOUS | Status: DC
  Administered 2013-01-17 – 2013-01-21 (×12): 500 mg via INTRAVENOUS
  Filled 2013-01-17 (×15): qty 100

## 2013-01-17 MED ORDER — VANCOMYCIN 50 MG/ML ORAL SOLUTION
500.0000 mg | Freq: Four times a day (QID) | ORAL | Status: DC
  Administered 2013-01-17 – 2013-01-21 (×16): 500 mg via ORAL
  Filled 2013-01-17 (×20): qty 10

## 2013-01-17 MED ORDER — KCL IN DEXTROSE-NACL 20-5-0.9 MEQ/L-%-% IV SOLN
INTRAVENOUS | Status: DC
  Administered 2013-01-17 – 2013-01-20 (×7): via INTRAVENOUS
  Filled 2013-01-17 (×11): qty 1000

## 2013-01-17 NOTE — Progress Notes (Signed)
3 Days Post-Op  Subjective: She is significantly distended.  Nausea with PO flagyl. She has only walked in her room.    Objective: Vital signs in last 24 hours: Temp:  [97.6 F (36.4 C)-97.9 F (36.6 C)] 97.8 F (36.6 C) (01/05 0552) Pulse Rate:  [80-83] 81 (01/05 0552) Resp:  [16-18] 16 (01/05 0552) BP: (114-122)/(54-63) 114/54 mmHg (01/05 0552) SpO2:  [93 %-100 %] 98 % (01/05 0747) Last BM Date: 01/16/13 850 PO recorded 100 emesis recorded Stool "78," recorded. Diet: clear liquids No labs for 48 hours She is on day 7 of Flagyl. Last film 01/15/13 shows significant distension. Intake/Output from previous day: 01/04 0701 - 01/05 0700 In: 3291 [P.O.:850; I.V.:2441] Out: 928 [Urine:750; Emesis/NG output:100; Stool:78] Intake/Output this shift:    General appearance: alert, cooperative, no distress and she is very distended. Resp: clear to auscultation bilaterally GI: Marked distension, BS hyperactive, umbilical site looks fine.  Lab Results:   Recent Labs  01/15/13 1210  WBC 3.9*  HGB 11.3*  HCT 36.0  PLT 272    BMET  Recent Labs  01/15/13 1210  NA 136*  K 4.4  CL 101  CO2 27  GLUCOSE 93  BUN 14  CREATININE 0.53  CALCIUM 8.5   PT/INR No results found for this basename: LABPROT, INR,  in the last 72 hours   Recent Labs Lab 01/11/13 2310  AST 22  ALT 19  ALKPHOS 117  BILITOT 0.7  PROT 8.0  ALBUMIN 4.2     Lipase     Component Value Date/Time   LIPASE 16 01/11/2013 2310     Studies/Results: Dg Abd 2 Views  01/15/2013   CLINICAL DATA:  Postop abdominal distention with surgery to repair abdominal hernia performed yesterday  EXAM: ABDOMEN - 2 VIEW  COMPARISON:  CT scan 01/12/2013  FINDINGS: There is diffuse small bowel distension of moderate severity to a diameter of 6 cm. There is minimal gas into colon. There are air-fluid levels throughout the small bowel. No free air is identified on decubitus films.  IMPRESSION: Findings consistent with  small-bowel obstruction.   Electronically Signed   By: Esperanza Heiraymond  Rubner M.D.   On: 01/15/2013 13:57    Medications: . enoxaparin (LOVENOX) injection  40 mg Subcutaneous Q24H  . insulin aspart  0-15 Units Subcutaneous Q4H  . metroNIDAZOLE  500 mg Oral Q8H  . mometasone-formoterol  2 puff Inhalation BID  . pantoprazole (PROTONIX) IV  40 mg Intravenous QHS   . 0.9 % NaCl with KCl 20 mEq / L 100 mL/hr at 01/16/13 1652  . lactated ringers 10 mL/hr at 01/14/13 0835   Assessment/Plan 1. Incarcerated umbilical hernia with SBO reduced preop.  S/p Umbilical hernia repair 01/14/13, Dr. Manus RuddMatthew Tsuei.  Post op C diff colitis 01/15/13 2. Diabetes mellitus  Glucose - 128 - 01/13/2013  3. HTN  4. COPD  5. DVT prophylaxis - Lovenox   Plan:  Back to ice chips, film, check labs, try her on some IV flagyl, but if that doesn't work may need to go to oral Vancomycin. She may also need an NG, we will see what films show.  LOS: 5 days    Tonya [place] 01/17/2013

## 2013-01-17 NOTE — Progress Notes (Signed)
Patient ID: Tonya Houston  female  ZHY:[id number]    DOB: 04/14/1940    DOA: 01/12/2013  PCP: Dan MakerR,RICHARD L, MD  Assessment/Plan: Active Problems: Incarcerated umbilical hernia with SBO - Management per primary team  Diabetes mellitus  -good blood glucose control  -Continue sliding scale insulin while patient follow and resume outpt meds once tolerating PO  HTN (hypertension) - stable  Chronic airway obstruction, not elsewhere classified -Stable, continue bronchodilators    Cdiff colitis : Still having diarrhea - Complaining of significant nausea after Flagyl, agree with changing to IV Flagyl and oral vancomycin, first episode, 14 days after DC would be sufficient  DVT Prophylaxis:  Code Status:  Family Communication:  Disposition: Per primary team    Subjective: Still having diarrhea, nausea, vomiting after Flagyl, abdominal distention  Objective: Weight change:   Intake/Output Summary (Last 24 hours) at 01/17/13 1310 Last data filed at 01/17/13 1132  Gross per 24 hour  Intake   2491 ml  Output   1127 ml  Net   1364 ml   Blood pressure 114/54, pulse 81, temperature 97.8 F (36.6 C), temperature source Oral, resp. rate 16, height 5' 2.5" (1.588 m), weight 73.483 kg (162 lb), SpO2 98.00%.  Physical Exam: General: Alert and awake, oriented x3, not in any acute distress. CVS: S1-S2 clear, no murmur rubs or gallops Chest: clear to auscultation bilaterally Abdomen: Distended, hyperactive bowel sounds Extremities: no cyanosis, clubbing or edema noted bilaterally  Lab Results: Basic Metabolic Panel:  Recent Labs Lab 01/15/13 1210 01/17/13 1030  NA 136* 138  K 4.4 3.8  CL 101 103  CO2 27 23  GLUCOSE 93 93  BUN 14 9  CREATININE 0.53 0.44*  CALCIUM 8.5 8.5  MG  --  1.5   Liver Function Tests:  Recent Labs Lab 01/11/13 2310  AST 22  ALT 19  ALKPHOS 117  BILITOT 0.7  PROT 8.0  ALBUMIN 4.2    Recent Labs Lab 01/11/13 2310  LIPASE 16   No  results found for this basename: AMMONIA,  in the last 168 hours CBC:  Recent Labs Lab 01/11/13 2310  01/15/13 1210 01/17/13 1030  WBC 21.4*  < > 3.9* 5.3  NEUTROABS 18.6*  --   --   --   HGB 15.5*  < > 11.3* 11.8*  HCT 46.5*  < > 36.0 37.1  MCV 84.5  < > 85.5 84.7  PLT 409*  < > 272 341  < > = values in this interval not displayed. Cardiac Enzymes: No results found for this basename: CKTOTAL, CKMB, CKMBINDEX, TROPONINI,  in the last 168 hours BNP: No components found with this basename: POCBNP,  CBG:  Recent Labs Lab 01/16/13 2011 01/17/13 0013 01/17/13 0430 01/17/13 0813 01/17/13 1216  GLUCAP 102* 96 82 104* 79     Micro Results: Recent Results (from the past 240 hour(s))  SURGICAL PCR SCREEN     Status: None   Collection Time    01/13/13 12:22 AM      Result Value Range Status   MRSA, PCR NEGATIVE  NEGATIVE Final   Staphylococcus aureus NEGATIVE  NEGATIVE Final   Comment:            The Xpert SA Assay (FDA     approved for NASAL specimens     in patients over 71 years of age),     is one component of     a comprehensive surveillance     program.  Test performance has  been validated by [facility] for patients greater     than or equal to 64 year old.     It is not intended     to diagnose infection nor to     guide or monitor treatment.  CLOSTRIDIUM DIFFICILE BY PCR     Status: Abnormal   Collection Time    01/15/13  2:45 PM      Result Value Range Status   C difficile by pcr POSITIVE (*) NEGATIVE Final   Comment: CRITICAL RESULT CALLED TO, READ BACK BY AND VERIFIED WITH:     CAIN,I RN 01/15/13 1745 WOOTEN,K    Studies/Results: Ct Abdomen Pelvis W Contrast  01/12/2013   CLINICAL DATA:  Abdominal pain since Monday.  EXAM: CT ABDOMEN AND PELVIS WITH CONTRAST  TECHNIQUE: Multidetector CT imaging of the abdomen and pelvis was performed using the standard protocol following bolus administration of intravenous contrast.  CONTRAST:  OMNIPAQUE  IOHEXOL 300 MG/ML  SOLN  COMPARISON:  None.  FINDINGS: Mild scarring in the anterior right lung. Small esophageal hiatal hernia.  Small amount of free fluid around the liver edge. No focal liver lesion. Surgical absence of the gallbladder. The pancreas, spleen, adrenal glands, kidneys, inferior vena cava, and retroperitoneal lymph nodes are unremarkable. Calcification of the aorta without aneurysm. The stomach is not distended. There is small bowel distention with multiple fluid-filled loops. There is an umbilical hernia which contains a loop of small bowel. The small bowel distal to the hernia is decompressed. The hernia serves as the site of obstruction. No free air or free fluid in the abdomen.  Pelvis: Decompressed stool-filled colon. Scattered diverticula. No diverticulitis. The appendix is normal. No free or loculated pelvic fluid collections. The uterus is likely surgically absent. No abnormal adnexal masses. No destructive bone lesions.  IMPRESSION: Umbilical hernia containing small bowel with small bowel obstruction proximal to the hernia. Small esophageal hiatal hernia. Small amount of free fluid around the liver edge.   Electronically Signed   By: Burman Nieves M.D.   On: 01/12/2013 02:23   Dg Chest Portable 1 View  01/12/2013   CLINICAL DATA:  Abdominal pain.  EXAM: PORTABLE CHEST - 1 VIEW  COMPARISON:  02/13/2012  FINDINGS: The heart size and mediastinal contours are within normal limits. Both lungs are clear. The visualized skeletal structures are unremarkable. No free air under the hemidiaphragms is demonstrated on semi-erect view.  IMPRESSION: No active disease.   Electronically Signed   By: Burman Nieves M.D.   On: 01/12/2013 01:27   Dg Abd 2 Views  01/15/2013   CLINICAL DATA:  Postop abdominal distention with surgery to repair abdominal hernia performed yesterday  EXAM: ABDOMEN - 2 VIEW  COMPARISON:  CT scan 01/12/2013  FINDINGS: There is diffuse small bowel distension of moderate  severity to a diameter of 6 cm. There is minimal gas into colon. There are air-fluid levels throughout the small bowel. No free air is identified on decubitus films.  IMPRESSION: Findings consistent with small-bowel obstruction.   Electronically Signed   By: Esperanza Heir M.D.   On: 01/15/2013 13:57   Dg Abd Portable 1v  01/12/2013   CLINICAL DATA:  Abdominal pain.  EXAM: PORTABLE ABDOMEN - 1 VIEW  COMPARISON:  None.  FINDINGS: Limited decubitus view is obtained. The nondependent portion of the right abdomen is not included within the field of view. No free air is demonstrated. However, small amount of free air could be  missed off the field of view. There is evidence of mild gaseous distention probably of small bowel with multiple air-fluid levels and string of beads sign. Changes suggest dilated fluid-filled small bowel. Obstruction should be excluded.  IMPRESSION: Abnormal bowel gas pattern with suggestion of fluid-filled distended small bowel. Obstruction should be excluded. No evidence of free air although portions of the abdomen are not included within the field of view for evaluation.   Electronically Signed   By: Burman Nieves M.D.   On: 01/12/2013 01:29    Medications: Scheduled Meds: . enoxaparin (LOVENOX) injection  40 mg Subcutaneous Q24H  . insulin aspart  0-15 Units Subcutaneous Q4H  . metronidazole  500 mg Intravenous Q8H  . mometasone-formoterol  2 puff Inhalation BID  . pantoprazole (PROTONIX) IV  40 mg Intravenous QHS  . vancomycin  500 mg Oral Q6H      LOS: 5 days   Elsia Lasota M.D. [facility] 01/17/2013, 1:10 PM Pager: [phone]  If 7PM-7AM, please contact night-coverage [email or web address] Password [email or web address]

## 2013-01-17 NOTE — Progress Notes (Signed)
Bile emesis @ 2300 pt. believes related to antibiotic. Difficult to record I/O, stool and urine together x3.

## 2013-01-17 NOTE — Progress Notes (Signed)
More distended but NT. Check films. I added oral Vanco to help treat her c diff. Incision CDI. Patient examined and I agree with the assessment and plan  Violeta GelinasBurke Ronney Honeywell, MD, MPH, FACS Pager: [phone]  01/17/2013 11:17 AM

## 2013-01-18 DIAGNOSIS — A0472 Enterocolitis due to Clostridium difficile, not specified as recurrent: Secondary | ICD-10-CM | POA: Diagnosis present

## 2013-01-18 LAB — GLUCOSE, CAPILLARY
GLUCOSE-CAPILLARY: 86 mg/dL (ref 70–99)
GLUCOSE-CAPILLARY: 116 mg/dL — AB (ref 70–99)
GLUCOSE-CAPILLARY: 113 mg/dL — AB (ref 70–99)
GLUCOSE-CAPILLARY: 137 mg/dL — AB (ref 70–99)
Glucose-Capillary: 130 mg/dL — ABNORMAL HIGH (ref 70–99)

## 2013-01-18 MED ORDER — MOMETASONE FURO-FORMOTEROL FUM 100-5 MCG/ACT IN AERO: 2.0000 | INHALATION_SPRAY | Freq: Two times a day (BID) | RESPIRATORY_TRACT | Status: DC

## 2013-01-18 MED ORDER — GLIMEPIRIDE 2 MG PO TABS: 2.0000 mg | ORAL_TABLET | Freq: Every day | ORAL | Status: DC

## 2013-01-18 NOTE — Progress Notes (Signed)
I have seen and examined the pt and agree with PA-Riebock's progress note. OK to start PO Cont abx

## 2013-01-18 NOTE — Progress Notes (Addendum)
Patient ID: Tonya Houston  female  ZOX:[id number]    DOB: 12-16-1940    DOA: 01/12/2013  PCP: Dan Maker, MD  Assessment/Plan: Active Problems: Incarcerated umbilical hernia with SBO - Management per primary team  Diabetes mellitus  - good blood glucose control  -Continue sliding scale insulin while in-patient  HTN (hypertension) - stable  Chronic airway obstruction, not elsewhere classified -Stable, continue bronchodilators  - left prescription for dulera    Severe Cdiff colitis : Still having diarrhea - Continue IV Flagyl and oral vancomycin, first episode, 14 days after DC would be sufficient - Discontinued PPI  DVT Prophylaxis:  Code Status:  Family Communication:  Disposition: Per primary team  For DISCHARGE/DISPO, recommend oral vancomycin 125mg  qid x 14 days (or 500mg  qid x 14 days) or dificid (fidaxomicin 200mg  BID) x 14days.  +/- Florastor .  She did not tolerate oral Flagyl due to severe nausea and vomiting  Due to diarrhea: - Would recommend to hold off on metformin, simvastatin, gemfibrozil at DC.  - DC'ed PPI  - Above medications can be restarted by PCP at follow-up.   For diabetes: Can be placed on amaryl 2mg  daily upon DC (left prescription).  I have dc'ed metformin in the AVS.    I will sign off, please call me if you have any questions.   Subjective: Nausea and diarrhea, afebrile, no chest pain or shortness of breath. Per patient, abdominal distention improving  Objective: Weight change:   Intake/Output Summary (Last 24 hours) at 01/18/13 1401 Last data filed at 01/18/13 1100  Gross per 24 hour  Intake 1288.33 ml  Output   1000 ml  Net 288.33 ml   Blood pressure 112/52, pulse 81, temperature 97.6 F (36.4 C), temperature source Oral, resp. rate 18, height 5' 2.5" (1.588 m), weight 73.483 kg (162 lb), SpO2 96.00%.  Physical Exam: General: Ax O x3 CVS: S1-S2 clear, no murmur rubs or gallops Chest: clear to auscultation bilaterally Abdomen:  Distended, + bowel sounds Extremities: no cyanosis, clubbing or edema noted bilaterally  Lab Results: Basic Metabolic Panel:  Recent Labs Lab 01/15/13 1210 01/17/13 1030  NA 136* 138  K 4.4 3.8  CL 101 103  CO2 27 23  GLUCOSE 93 93  BUN 14 9  CREATININE 0.53 0.44*  CALCIUM 8.5 8.5  MG  --  1.5   Liver Function Tests:  Recent Labs Lab 01/11/13 2310  AST 22  ALT 19  ALKPHOS 117  BILITOT 0.7  PROT 8.0  ALBUMIN 4.2    Recent Labs Lab 01/11/13 2310  LIPASE 16   No results found for this basename: AMMONIA,  in the last 168 hours CBC:  Recent Labs Lab 01/11/13 2310  01/15/13 1210 01/17/13 1030  WBC 21.4*  < > 3.9* 5.3  NEUTROABS 18.6*  --   --   --   HGB 15.5*  < > 11.3* 11.8*  HCT 46.5*  < > 36.0 37.1  MCV 84.5  < > 85.5 84.7  PLT 409*  < > 272 341  < > = values in this interval not displayed. Cardiac Enzymes: No results found for this basename: CKTOTAL, CKMB, CKMBINDEX, TROPONINI,  in the last 168 hours BNP: No components found with this basename: POCBNP,  CBG:  Recent Labs Lab 01/17/13 2002 01/17/13 2340 01/18/13 0415 01/18/13 0735 01/18/13 1158  GLUCAP 83 115* 130* 86 116*     Micro Results: Recent Results (from the past 240 hour(s))  SURGICAL PCR SCREEN  Status: None   Collection Time    01/13/13 12:22 AM      Result Value Range Status   MRSA, PCR NEGATIVE  NEGATIVE Final   Staphylococcus aureus NEGATIVE  NEGATIVE Final   Comment:            The Xpert SA Assay (FDA     approved for NASAL specimens     in patients over 44 years of age),     is one component of     a comprehensive surveillance     program.  Test performance has     been validated by [employer] for patients greater     than or equal to 91 year old.     It is not intended     to diagnose infection nor to     guide or monitor treatment.  CLOSTRIDIUM DIFFICILE BY PCR     Status: Abnormal   Collection Time    01/15/13  2:45 PM      Result Value Range Status    C difficile by pcr POSITIVE (*) NEGATIVE Final   Comment: CRITICAL RESULT CALLED TO, READ BACK BY AND VERIFIED WITH:     CAIN,I RN 01/15/13 1745 WOOTEN,K    Studies/Results: Ct Abdomen Pelvis W Contrast  01/12/2013   CLINICAL DATA:  Abdominal pain since Monday.  EXAM: CT ABDOMEN AND PELVIS WITH CONTRAST  TECHNIQUE: Multidetector CT imaging of the abdomen and pelvis was performed using the standard protocol following bolus administration of intravenous contrast.  CONTRAST:  OMNIPAQUE IOHEXOL 300 MG/ML  SOLN  COMPARISON:  None.  FINDINGS: Mild scarring in the anterior right lung. Small esophageal hiatal hernia.  Small amount of free fluid around the liver edge. No focal liver lesion. Surgical absence of the gallbladder. The pancreas, spleen, adrenal glands, kidneys, inferior vena cava, and retroperitoneal lymph nodes are unremarkable. Calcification of the aorta without aneurysm. The stomach is not distended. There is small bowel distention with multiple fluid-filled loops. There is an umbilical hernia which contains a loop of small bowel. The small bowel distal to the hernia is decompressed. The hernia serves as the site of obstruction. No free air or free fluid in the abdomen.  Pelvis: Decompressed stool-filled colon. Scattered diverticula. No diverticulitis. The appendix is normal. No free or loculated pelvic fluid collections. The uterus is likely surgically absent. No abnormal adnexal masses. No destructive bone lesions.  IMPRESSION: Umbilical hernia containing small bowel with small bowel obstruction proximal to the hernia. Small esophageal hiatal hernia. Small amount of free fluid around the liver edge.   Electronically Signed   By: Burman Nieves M.D.   On: 01/12/2013 02:23   Dg Chest Portable 1 View  01/12/2013   CLINICAL DATA:  Abdominal pain.  EXAM: PORTABLE CHEST - 1 VIEW  COMPARISON:  02/13/2012  FINDINGS: The heart size and mediastinal contours are within normal limits. Both lungs  are clear. The visualized skeletal structures are unremarkable. No free air under the hemidiaphragms is demonstrated on semi-erect view.  IMPRESSION: No active disease.   Electronically Signed   By: Burman Nieves M.D.   On: 01/12/2013 01:27   Dg Abd 2 Views  01/15/2013   CLINICAL DATA:  Postop abdominal distention with surgery to repair abdominal hernia performed yesterday  EXAM: ABDOMEN - 2 VIEW  COMPARISON:  CT scan 01/12/2013  FINDINGS: There is diffuse small bowel distension of moderate severity to a diameter of 6 cm. There is minimal  gas into colon. There are air-fluid levels throughout the small bowel. No free air is identified on decubitus films.  IMPRESSION: Findings consistent with small-bowel obstruction.   Electronically Signed   By: Esperanza Heiraymond  Rubner M.D.   On: 01/15/2013 13:57   Dg Abd Portable 1v  01/12/2013   CLINICAL DATA:  Abdominal pain.  EXAM: PORTABLE ABDOMEN - 1 VIEW  COMPARISON:  None.  FINDINGS: Limited decubitus view is obtained. The nondependent portion of the right abdomen is not included within the field of view. No free air is demonstrated. However, small amount of free air could be missed off the field of view. There is evidence of mild gaseous distention probably of small bowel with multiple air-fluid levels and string of beads sign. Changes suggest dilated fluid-filled small bowel. Obstruction should be excluded.  IMPRESSION: Abnormal bowel gas pattern with suggestion of fluid-filled distended small bowel. Obstruction should be excluded. No evidence of free air although portions of the abdomen are not included within the field of view for evaluation.   Electronically Signed   By: Burman NievesWilliam  Stevens M.D.   On: 01/12/2013 01:29    Medications: Scheduled Meds: . enoxaparin (LOVENOX) injection  40 mg Subcutaneous Q24H  . insulin aspart  0-15 Units Subcutaneous Q4H  . metronidazole  500 mg Intravenous Q8H  . mometasone-formoterol  2 puff Inhalation BID  . pantoprazole  (PROTONIX) IV  40 mg Intravenous QHS  . vancomycin  500 mg Oral Q6H      LOS: 6 days   Shakyla Nolley M.D. [facility] 01/18/2013, 2:01 PM Pager: [phone]  If 7PM-7AM, please contact night-coverage [email or web address] Password [email or web address]

## 2013-01-18 NOTE — Evaluation (Signed)
Physical Therapy Evaluation Patient Details Name: Tonya PughJeanette Houston MRN: [id number] DOB: 11/03/1940 Today's Date: 01/18/2013 Time: 1019-1040 PT Time Calculation (min): 21 min  PT Assessment / Plan / Recommendation History of Present Illness  pt presents with Hernia Repair and SBO.    Clinical Impression  Pt generally weak and deconditioned.  Pt becomes SOB during ambulation 2/2 hx COPD per pt.  Will continue to follow.      PT Assessment  Patient needs continued PT services    Follow Up Recommendations  Home health PT;Supervision/Assistance - 24 hour    Does the patient have the potential to tolerate intense rehabilitation      Barriers to Discharge        Equipment Recommendations  Rolling walker with 5" wheels    Recommendations for Other Services     Frequency Min 3X/week    Precautions / Restrictions Precautions Precautions: Fall Restrictions Weight Bearing Restrictions: No   Pertinent Vitals/Pain Abdominal "tightness"      Mobility  Bed Mobility Bed Mobility: Sit to Sidelying Right Sit to Sidelying Right: 6: Modified independent (Device/Increase time) Details for Bed Mobility Assistance: pt demos good technique 2/2 abdominal pain.   Transfers Transfers: Sit to Stand;Stand to Sit Sit to Stand: 6: Modified independent (Device/Increase time);With upper extremity assist;From chair/3-in-1;From bed Stand to Sit: 6: Modified independent (Device/Increase time);With upper extremity assist;To bed Ambulation/Gait Ambulation/Gait Assistance: 4: Min guard Ambulation Distance (Feet): 80 Feet Assistive device:  (Held IV pole) Ambulation/Gait Assistance Details: pt moves slowly and labored.  pt utilizes IV pole for support.   Gait Pattern: Step-through pattern;Decreased stride length Stairs: No Wheelchair Mobility Wheelchair Mobility: No    Exercises     PT Diagnosis: Difficulty walking;Acute pain  PT Problem List: Decreased strength;Decreased activity tolerance;Decreased  balance;Decreased mobility;Decreased knowledge of use of DME;Cardiopulmonary status limiting activity;Pain PT Treatment Interventions: DME instruction;Gait training;Stair training;Functional mobility training;Therapeutic activities;Therapeutic exercise;Balance training;Patient/family education     PT Goals(Current goals can be found in the care plan section) Acute Rehab PT Goals Patient Stated Goal: Feel better PT Goal Formulation: With patient Time For Goal Achievement: 02/01/13 Potential to Achieve Goals: Good  Visit Information  Last PT Received On: 01/18/13 Assistance Needed: +1 History of Present Illness: pt presents with Hernia Repair and SBO.         Prior Functioning  Home Living Family/patient expects to be discharged to:: Private residence Living Arrangements: Children;Other relatives Available Help at Discharge: Family;Available PRN/intermittently Type of Home: House Home Access: Stairs to enter Entergy CorporationEntrance Stairs-Number of Steps: 5 Entrance Stairs-Rails: Right Home Layout: One level Home Equipment: None Prior Function Level of Independence: Independent Communication Communication: No difficulties    Cognition  Cognition Arousal/Alertness: Awake/alert Behavior During Therapy: WFL for tasks assessed/performed Overall Cognitive Status: Within Functional Limits for tasks assessed    Extremity/Trunk Assessment Upper Extremity Assessment Upper Extremity Assessment: Overall WFL for tasks assessed Lower Extremity Assessment Lower Extremity Assessment: Generalized weakness   Balance Balance Balance Assessed: No  End of Session PT - End of Session Equipment Utilized During Treatment: Gait belt Activity Tolerance: Patient limited by fatigue;Patient limited by pain Patient left: in bed;with call bell/phone within reach Nurse Communication: Mobility status  GP     Sunny SchleinRitenour, Eloina Ergle F, [place] CarolinaPT [phone] 01/18/2013, 12:09 PM

## 2013-01-18 NOTE — Progress Notes (Signed)
4 Days Post-Op  Subjective: C/o nausea, multiple loose BMs.  Ambulating.  Denies cp, sob.  Objective: Vital signs in last 24 hours: Temp:  [97.5 F (36.4 C)-98.2 F (36.8 C)] 97.6 F (36.4 C) (01/06 0502) Pulse Rate:  [81-83] 81 (01/06 0502) Resp:  [18] 18 (01/06 0502) BP: (102-120)/(42-54) 112/52 mmHg (01/06 0502) SpO2:  [93 %-98 %] 95 % (01/06 0502) Last BM Date: 01/17/13  Intake/Output from previous day: 01/05 0701 - 01/06 0700 In: 1408.3 [P.O.:120; I.V.:1188.3; IV Piggyback:100] Out: 1552 [Urine:650; Stool:902] Intake/Output this shift:   PE General appearance: alert, cooperative and no distress Resp: clear to auscultation bilaterally Cardio: regular rate and rhythm, S1, S2 normal, no murmur, click, rub or gallop GI: +bs, abdomen is distended. umbilical incision is c/d/i. appropriately tender.   Lab Results:   Recent Labs  01/15/13 1210 01/17/13 1030  WBC 3.9* 5.3  HGB 11.3* 11.8*  HCT 36.0 37.1  PLT 272 341   BMET  Recent Labs  01/15/13 1210 01/17/13 1030  NA 136* 138  K 4.4 3.8  CL 101 103  CO2 27 23  GLUCOSE 93 93  BUN 14 9  CREATININE 0.53 0.44*  CALCIUM 8.5 8.5   PT/INR No results found for this basename: LABPROT, INR,  in the last 72 hours ABG No results found for this basename: PHART, PCO2, PO2, HCO3,  in the last 72 hours  Studies/Results: Dg Abd 2 Views  01/17/2013   CLINICAL DATA:  Small bowel obstruction  EXAM: ABDOMEN - 2 VIEW  COMPARISON:  01/15/2013  FINDINGS: There is less distention of the small bowel than before. Small bowel diameter is now 4.3 cm, previously 6 cm. Air is present in the transverse colon and the descending colon. Scattered small bowel air-fluid levels on the upright exam. No definite free air. Lung bases appear clear.  IMPRESSION: Slight improvement in the small bowel obstruction pattern.   Electronically Signed   By: Ruel Favorsrevor  Shick M.D.   On: 01/17/2013 14:21    Anti-infectives: Anti-infectives   Start     Dose/Rate  Route Frequency Ordered Stop   01/17/13 1400  metroNIDAZOLE (FLAGYL) IVPB 500 mg     500 mg 100 mL/hr over 60 Minutes Intravenous Every 8 hours 01/17/13 0951     01/17/13 1200  vancomycin (VANCOCIN) 50 mg/mL oral solution 500 mg     500 mg Oral 4 times per day 01/17/13 1117     01/15/13 2200  metroNIDAZOLE (FLAGYL) tablet 500 mg  Status:  Discontinued     500 mg Oral 3 times per day 01/15/13 1758 01/17/13 0951   01/14/13 0815  ceFAZolin (ANCEF) IVPB 2 g/50 mL premix     2 g 100 mL/hr over 30 Minutes Intravenous On call 01/14/13 0756 01/15/13 0815      Assessment/Plan: 1. Incarcerated umbilical hernia with SBO reduced preop.  S/p Umbilical hernia repair 01/14/13, Dr. Manus RuddMatthew Tsuei.  Post op C diff colitis 01/15/13  2. Diabetes mellitus-stable, appreciate IM assistance. 3. HTN  4. COPD  5. DVT prophylaxis - Lovenox  Plan:POD#4, films are improved, but she remains distended on exam.  Vanc and flagyl.  ?nausea medication related.  May be able to start clears.  Mobilize, pain control.    LOS: 6 days    Gwin Eagon ANP-BC 01/18/2013 8:56 AM

## 2013-01-19 ENCOUNTER — Encounter (HOSPITAL_COMMUNITY): Payer: Self-pay | Admitting: Surgery

## 2013-01-19 LAB — CBC
HEMATOCRIT: 37.4 % (ref 36.0–46.0)
Hemoglobin: 12.2 g/dL (ref 12.0–15.0)
MCH: 27.2 pg (ref 26.0–34.0)
MCHC: 32.6 g/dL (ref 30.0–36.0)
MCV: 83.3 fL (ref 78.0–100.0)
Platelets: 419 10*3/uL — ABNORMAL HIGH (ref 150–400)
RBC: 4.49 MIL/uL (ref 3.87–5.11)
RDW: 14.2 % (ref 11.5–15.5)
WBC: 6.5 10*3/uL (ref 4.0–10.5)

## 2013-01-19 LAB — BASIC METABOLIC PANEL
BUN: 4 mg/dL — AB (ref 6–23)
CHLORIDE: 107 meq/L (ref 96–112)
CO2: 25 meq/L (ref 19–32)
CREATININE: 0.51 mg/dL (ref 0.50–1.10)
Calcium: 8.1 mg/dL — ABNORMAL LOW (ref 8.4–10.5)
GFR calc Af Amer: >90 mL/min (ref >90)
GFR calc non Af Amer: >90 mL/min (ref >90)
Glucose, Bld: 123 mg/dL — ABNORMAL HIGH (ref 70–99)
Potassium: 3.9 mEq/L (ref 3.7–5.3)
Sodium: 144 mEq/L (ref 137–147)

## 2013-01-19 LAB — GLUCOSE, CAPILLARY
GLUCOSE-CAPILLARY: 83 mg/dL (ref 70–99)
GLUCOSE-CAPILLARY: 147 mg/dL — AB (ref 70–99)
Glucose-Capillary: 106 mg/dL — ABNORMAL HIGH (ref 70–99)
Glucose-Capillary: 129 mg/dL — ABNORMAL HIGH (ref 70–99)
Glucose-Capillary: 131 mg/dL — ABNORMAL HIGH (ref 70–99)
Glucose-Capillary: 94 mg/dL (ref 70–99)

## 2013-01-19 MED ORDER — SACCHAROMYCES BOULARDII 250 MG PO CAPS
250.0000 mg | ORAL_CAPSULE | Freq: Two times a day (BID) | ORAL | Status: DC
  Administered 2013-01-19 – 2013-01-21 (×5): 250 mg via ORAL
  Filled 2013-01-19 (×6): qty 1

## 2013-01-19 NOTE — Progress Notes (Signed)
5 Days Post-Op  Subjective: Had 1 BM yesterday, mild nausea, abdominal pain has improved.  Ambulating in hallways.   Objective: Vital signs in last 24 hours: Temp:  [97.7 F (36.5 C)-98.1 F (36.7 C)] 97.7 F (36.5 C) (01/07 0507) Pulse Rate:  [76-84] 81 (01/07 0507) Resp:  [18-20] 20 (01/07 0507) BP: (111-136)/(48-75) 136/75 mmHg (01/07 0507) SpO2:  [91 %-96 %] 91 % (01/07 0507) Last BM Date: 01/18/13  Intake/Output from previous day: 01/06 0701 - 01/07 0700 In: 2850.3 [P.O.:360; I.V.:2390.3; IV Piggyback:100] Out: 700 [Stool:700] Intake/Output this shift:   PE  General appearance: alert, cooperative and no distress  Resp: clear to auscultation bilaterally  Cardio: regular rate and rhythm, S1, S2 normal, no murmur, click, rub or gallop  GI: +bs, abdomen is distended. umbilical incision is c/d/i with mild erythema inferior to the wound. appropriately tender.  Ext: trace BLE edema   Lab Results:   Recent Labs  01/17/13 1030  WBC 5.3  HGB 11.8*  HCT 37.1  PLT 341   BMET  Recent Labs  01/17/13 1030  NA 138  K 3.8  CL 103  CO2 23  GLUCOSE 93  BUN 9  CREATININE 0.44*  CALCIUM 8.5   PT/INR No results found for this basename: LABPROT, INR,  in the last 72 hours ABG No results found for this basename: PHART, PCO2, PO2, HCO3,  in the last 72 hours  Studies/Results: Dg Abd 2 Views  01/17/2013   CLINICAL DATA:  Small bowel obstruction  EXAM: ABDOMEN - 2 VIEW  COMPARISON:  01/15/2013  FINDINGS: There is less distention of the small bowel than before. Small bowel diameter is now 4.3 cm, previously 6 cm. Air is present in the transverse colon and the descending colon. Scattered small bowel air-fluid levels on the upright exam. No definite free air. Lung bases appear clear.  IMPRESSION: Slight improvement in the small bowel obstruction pattern.   Electronically Signed   By: Ruel Favorsrevor  Shick M.D.   On: 01/17/2013 14:21    Anti-infectives: Anti-infectives   Start      Dose/Rate Route Frequency Ordered Stop   01/17/13 1400  metroNIDAZOLE (FLAGYL) IVPB 500 mg     500 mg 100 mL/hr over 60 Minutes Intravenous Every 8 hours 01/17/13 0951     01/17/13 1200  vancomycin (VANCOCIN) 50 mg/mL oral solution 500 mg     500 mg Oral 4 times per day 01/17/13 1117     01/15/13 2200  metroNIDAZOLE (FLAGYL) tablet 500 mg  Status:  Discontinued     500 mg Oral 3 times per day 01/15/13 1758 01/17/13 0951   01/14/13 0815  ceFAZolin (ANCEF) IVPB 2 g/50 mL premix     2 g 100 mL/hr over 30 Minutes Intravenous On call 01/14/13 0756 01/15/13 0815      Assessment/Plan: Incarcerated umbilical hernia with SBO reduced preop.  S/p Umbilical hernia repair 01/14/13, Dr. Manus RuddMatthew Tsuei.  POD #5 Advance to full liquid diet Mobilize Pain control Antiemetics, hopefully will improve after flagyl course is finished  Post op C diff colitis 01/15/13  Improving, 1 BM yesterday. Will check her electrolytes due to severe diarrhea. Continue with IV flagyl D#4/14(stop at discharge, unable to tolerate PO) and oral Vanc D#2/14 Add Florastor Diabetes mellitus-CBGs are stable, continue SSI Hold metformin at discharge due to diarrhea, follow up with PCP to resume.  Also hold simvastatin and gemfibrozil until diarrhea resolves.  HTN  -stable/ c/w home meds COPD  -stable, c/w dulera DVT prophylaxis-  Lovenox    LOS: 7 days    Bonner Puna [facility] ANP-BC Pager [phone] 01/19/2013 8:57 AM

## 2013-01-19 NOTE — Progress Notes (Signed)
I have seen and examined the pt and agree with NP-Reibock's progress note. Abd pain improving Will adv diet slowly Hopefully home in 1-2 if con't to do well

## 2013-01-19 NOTE — Progress Notes (Signed)
Physical Therapy Treatment Patient Details Name: Tonya PughJeanette Houston MRN: [id number] DOB: 12/14/1940 Today's Date: 01/19/2013 Time: [id number] PT Time Calculation (min): 14 min  PT Assessment / Plan / Recommendation  History of Present Illness pt presents with Hernia Repair and SBO.     PT Comments   Patient progressing well. Encouraged daily ambulation. Continue with current POC  Follow Up Recommendations  Home health PT;Supervision/Assistance - 24 hour     Does the patient have the potential to tolerate intense rehabilitation     Barriers to Discharge        Equipment Recommendations  Rolling walker with 5" wheels    Recommendations for Other Services    Frequency Min 3X/week   Progress towards PT Goals Progress towards PT goals: Progressing toward goals  Plan Current plan remains appropriate    Precautions / Restrictions Precautions Precautions: Fall   Pertinent Vitals/Pain Complained of gas pain. patient repositioned for comfort     Mobility  Ambulation/Gait Ambulation Distance (Feet): 240 Feet Gait velocity: decreased General Gait Details: Patient used IV pole. Gait steady but guarded    Exercises     PT Diagnosis:    PT Problem List:   PT Treatment Interventions:     PT Goals (current goals can now be found in the care plan section)    Visit Information  Last PT Received On: 01/19/13 Assistance Needed: +1 History of Present Illness: pt presents with Hernia Repair and SBO.      Subjective Data      Cognition  Cognition Arousal/Alertness: Awake/alert Behavior During Therapy: WFL for tasks assessed/performed Overall Cognitive Status: Within Functional Limits for tasks assessed    Balance     End of Session PT - End of Session Equipment Utilized During Treatment: Gait belt Activity Tolerance: Patient tolerated treatment well;Patient limited by fatigue Patient left: in chair;with call bell/phone within reach Nurse Communication: Mobility status   GP      Fredrich BirksRobinette, Julia Elizabeth 01/19/2013, 9:27 AM 01/19/2013 Fredrich Birksobinette, Julia Elizabeth PTA [phone] pager [phone] office

## 2013-01-20 LAB — GLUCOSE, CAPILLARY
GLUCOSE-CAPILLARY: 105 mg/dL — AB (ref 70–99)
GLUCOSE-CAPILLARY: 123 mg/dL — AB (ref 70–99)
Glucose-Capillary: 117 mg/dL — ABNORMAL HIGH (ref 70–99)
Glucose-Capillary: 104 mg/dL — ABNORMAL HIGH (ref 70–99)
Glucose-Capillary: 111 mg/dL — ABNORMAL HIGH (ref 70–99)
Glucose-Capillary: 138 mg/dL — ABNORMAL HIGH (ref 70–99)

## 2013-01-20 NOTE — Progress Notes (Signed)
Patient sleeping soundly.  Advance diet as tolerated. Hopefully will be ready for discharge soon.  Wilmon ArmsMatthew K. Corliss Skainssuei, MD, [facility]  General/ Trauma Surgery  01/20/2013 2:55 PM

## 2013-01-20 NOTE — Progress Notes (Signed)
PT Cancellation Note  Patient Details Name: Nicky PughJeanette Baccari MRN: [id number] DOB: 05/18/1940   Cancelled Treatment:    Reason Eval/Treat Not Completed: Fatigue/lethargy limiting ability to participate. Patient requesting to hold this afternoon due to being sleepy. Will follow up in AM   Robinette, Adline PotterJulia Elizabeth 01/20/2013, 2:39 PM

## 2013-01-20 NOTE — Progress Notes (Signed)
6 Days Post-Op  Subjective: Stools are less loose, 3BM yesterday, 1 today.  tolerated clears, but doesn't like the taste, did not get full liquid tray  Objective: Vital signs in last 24 hours: Temp:  [97.4 F (36.3 C)-98.3 F (36.8 C)] 97.4 F (36.3 C) (01/08 0700) Pulse Rate:  [75-86] 75 (01/08 0700) Resp:  [18-20] 18 (01/08 0700) BP: (113-120)/(44-60) 113/44 mmHg (01/08 0700) SpO2:  [93 %-95 %] 95 % (01/08 0700) Last BM Date: 01/19/13  Intake/Output from previous day: 01/07 0701 - 01/08 0700 In: 1596 [P.O.:360; I.V.:1136; IV Piggyback:100] Out: 501 [Urine:501] Intake/Output this shift:   PE  General appearance: alert, cooperative and no distress  Resp: clear to auscultation bilaterally  Cardio: regular rate and rhythm, S1, S2 normal, no murmur, click, rub or gallop  GI: +bs, abdomen is distended. umbilical incision is c/d/i with mild erythema inferior to the wound. appropriately tender.  Ext: trace BLE edema  Lab Results:   Recent Labs  01/17/13 1030 01/19/13 0958  WBC 5.3 6.5  HGB 11.8* 12.2  HCT 37.1 37.4  PLT 341 419*   BMET  Recent Labs  01/17/13 1030 01/19/13 0958  NA 138 144  K 3.8 3.9  CL 103 107  CO2 23 25  GLUCOSE 93 123*  BUN 9 4*  CREATININE 0.44* 0.51  CALCIUM 8.5 8.1*   PT/INR No results found for this basename: LABPROT, INR,  in the last 72 hours ABG No results found for this basename: PHART, PCO2, PO2, HCO3,  in the last 72 hours  Studies/Results: No results found.  Anti-infectives: Anti-infectives   Start     Dose/Rate Route Frequency Ordered Stop   01/17/13 1400  metroNIDAZOLE (FLAGYL) IVPB 500 mg     500 mg 100 mL/hr over 60 Minutes Intravenous Every 8 hours 01/17/13 0951     01/17/13 1200  vancomycin (VANCOCIN) 50 mg/mL oral solution 500 mg     500 mg Oral 4 times per day 01/17/13 1117     01/15/13 2200  metroNIDAZOLE (FLAGYL) tablet 500 mg  Status:  Discontinued     500 mg Oral 3 times per day 01/15/13 1758 01/17/13 0951   01/14/13 0815  ceFAZolin (ANCEF) IVPB 2 g/50 mL premix     2 g 100 mL/hr over 30 Minutes Intravenous On call 01/14/13 0756 01/15/13 0815      Assessment/Plan: Incarcerated umbilical hernia with SBO reduced preop.  S/p Umbilical hernia repair 01/14/13, Dr. Manus RuddMatthew Tsuei.  POD #6  -Advance to full liquid diet, if she tolerates, soft diet this evening -Mobilize  -Pain control  -Antiemetics, hopefully will improve after flagyl course is finished  Post op C diff colitis 01/15/13  -Improving, 3 BM yesterday. Stable electrolytes 1/7 -Continue with IV flagyl D#5/14(stop at discharge, unable to tolerate PO) and oral Vanc D#3/14  -Continue with Florastor  Diabetes mellitus-CBGs are stable, continue SSI  -Hold metformin at discharge due to diarrhea, follow up with PCP to resume. Also hold simvastatin and gemfibrozil until diarrhea resolves.  HTN  -stable/ c/w home meds  COPD  -stable, c/w dulera  DVT prophylaxis- Lovenox    LOS: 8 days    Bonner PunaRIEBOCK, [facility]  ANP-BC Pager [phone]  01/20/2013 9:27 AM

## 2013-01-21 ENCOUNTER — Telehealth (INDEPENDENT_AMBULATORY_CARE_PROVIDER_SITE_OTHER): Payer: Self-pay | Admitting: General Surgery

## 2013-01-21 ENCOUNTER — Other Ambulatory Visit (INDEPENDENT_AMBULATORY_CARE_PROVIDER_SITE_OTHER): Payer: Self-pay | Admitting: General Surgery

## 2013-01-21 ENCOUNTER — Telehealth (INDEPENDENT_AMBULATORY_CARE_PROVIDER_SITE_OTHER): Payer: Self-pay

## 2013-01-21 LAB — GLUCOSE, CAPILLARY
GLUCOSE-CAPILLARY: 89 mg/dL (ref 70–99)
GLUCOSE-CAPILLARY: 111 mg/dL — AB (ref 70–99)
Glucose-Capillary: 100 mg/dL — ABNORMAL HIGH (ref 70–99)
Glucose-Capillary: 81 mg/dL (ref 70–99)

## 2013-01-21 MED ORDER — HYDROCODONE-ACETAMINOPHEN 5-325 MG PO TABS: 1.0000 | ORAL_TABLET | Freq: Four times a day (QID) | ORAL | Status: DC | PRN

## 2013-01-21 MED ORDER — VANCOMYCIN 50 MG/ML ORAL SOLUTION: 125.0000 mg | Freq: Four times a day (QID) | ORAL | Status: AC

## 2013-01-21 MED ORDER — SACCHAROMYCES BOULARDII 250 MG PO CAPS: 250.0000 mg | ORAL_CAPSULE | Freq: Two times a day (BID) | ORAL | Status: DC

## 2013-01-21 MED ORDER — ACETAMINOPHEN 325 MG PO TABS: 650.0000 mg | ORAL_TABLET | Freq: Four times a day (QID) | ORAL | Status: DC | PRN

## 2013-01-21 NOTE — Discharge Summary (Signed)
Ready for discharge. Abdominal pain minimal Diarrhea improving.  Wilmon ArmsMatthew K. Corliss Skainssuei, MD, [facility]  General/ Trauma Surgery  01/21/2013 11:57 AM

## 2013-01-21 NOTE — Discharge Instructions (Signed)
PLEASE NOTE THAT SOME OF YOUR MEDICATION HAVE BEEN STOPPED, FOLLOW UP WITH YOUR PRIMARY CARE DOCTOR WHO WILL DETERMINE WHEN TO RESUME(SHOULD BE WHEN DIARRHEA STOPS  YOU HAD C. DIFF WHICH IS AN INFECTION IN YOUR COLON.  YOU MUST CONTINUE WITH THE LIQUID ANTIBIOTIC FOR AN ADDITIONAL 9 DAYS. IF THE DIARRHEA PERSISTS PLEASE CALL OUR OFFICE.  ABDOMINAL SURGERY: POST OP INSTRUCTIONS  1. DIET: Follow a light bland diet the first 24 hours after arrival home, such as soup, liquids, crackers, etc.  Be sure to include lots of fluids daily.  Avoid fast food or heavy meals as your are more likely to get nauseated.  Eat a low fat the next few days after surgery.   2. Take your usually prescribed home medications unless otherwise directed. 3. PAIN CONTROL: a. Pain is best controlled by a usual combination of three different methods TOGETHER: i. Ice/Heat ii. Over the counter pain medication iii. Prescription pain medication b. Most patients will experience some swelling and bruising around the incisions.  Ice packs or heating pads (30-60 minutes up to 6 times a day) will help. Use ice for the first few days to help decrease swelling and bruising, then switch to heat to help relax tight/sore spots and speed recovery.  Some people prefer to use ice alone, heat alone, alternating between ice & heat.  Experiment to what works for you.  Swelling and bruising can take several weeks to resolve.   c. It is helpful to take an over-the-counter pain medication regularly for the first few weeks.  Choose one of the following that works best for you: i. Acetaminophen (Tylenol, etc) 500-650mg  four times a day (every meal & bedtime) d. A  prescription for pain medication (such as oxycodone, hydrocodone, etc) should be given to you upon discharge.  Take your pain medication as prescribed.  i. If you are having problems/concerns with the prescription medicine (does not control pain, nausea, vomiting, rash, itching, etc), please call  us [phone] to see if we need to switch you to a different pain medicine that will work better for you and/or control your side effect better. ii. If you need a refill on your pain medication, please contact your pharmacy.  They will contact our office to request authorization. Prescriptions will not be filled after 5 pm or on week-ends. 4. Avoid getting constipated.  Between the surgery and the pain medications, it is common to experience some constipation.  Increasing fluid intake and taking a fiber supplement (such as Metamucil, Citrucel, FiberCon, MiraLax, etc) 1-2 times a day regularly will usually help prevent this problem from occurring.  A mild laxative (prune juice, Milk of Magnesia, MiraLax, etc) should be taken according to package directions if there are no bowel movements after 48 hours.   5. Watch out for diarrhea.  If you have many loose bowel movements, simplify your diet to bland foods & liquids for a few days.  Stop any stool softeners and decrease your fiber supplement.  Switching to mild anti-diarrheal medications (Kayopectate, Pepto Bismol) can help.  If this worsens or does not improve, please call us. 6. Wash / shower every day.  You may shower over the incision / wound.  Avoid baths until the skin is fully healed.  Continue to shower over incision(s) after the dressing is off. 7. Remove your waterproof bandages 5 days after surgery.  You may leave the incision open to air.  You may replace a dressing/Band-Aid to cover the incision for comfort  if you wish. 8. ACTIVITIES as tolerated:   a. You may resume regular (light) daily activities beginning the next day--such as daily self-care, walking, climbing stairs--gradually increasing activities as tolerated.  If you can walk 30 minutes without difficulty, it is safe to try more intense activity such as jogging, treadmill, bicycling, low-impact aerobics, swimming, etc. b. Save the most intensive and strenuous activity for last such as  sit-ups, heavy lifting, contact sports, etc  Refrain from any heavy lifting or straining until you are off narcotics for pain control.   c. DO NOT PUSH THROUGH PAIN.  Let pain be your guide: If it hurts to do something, don't do it.  Pain is your body warning you to avoid that activity for another week until the pain goes down. d. You may drive when you are no longer taking prescription pain medication, you can comfortably wear a seatbelt, and you can safely maneuver your car and apply brakes. e. Bonita Quin may have sexual intercourse when it is comfortable.  9. FOLLOW UP in our office a. Please call [facility] at [phone] to set up an appointment to see your surgeon in the office for a follow-up appointment approximately 1-2 weeks after your surgery. b. Make sure that you call for this appointment the day you arrive home to insure a convenient appointment time. 10. IF YOU HAVE DISABILITY OR FAMILY LEAVE FORMS, BRING THEM TO THE OFFICE FOR PROCESSING.  DO NOT GIVE THEM TO YOUR DOCTOR.   WHEN TO CALL us [phone]: 1. Poor pain control 2. Reactions / problems with new medications (rash/itching, nausea, etc)  3. Fever over 101.5 F (38.5 C) 4. Inability to urinate 5. Nausea and/or vomiting 6. Worsening swelling or bruising 7. Continued bleeding from incision. 8. Increased pain, redness, or drainage from the incision  The clinic staff is available to answer your questions during regular business hours (8:30am-5pm).  Please dont hesitate to call and ask to speak to one of our nurses for clinical concerns.   A surgeon from [facility] is always on call at the hospitals   If you have a medical emergency, go to the nearest emergency room or call 911.    [facility] [address], [place], [place]  [address] ? MAIN: [phone] ? TOLL FREE: [phone] ? FAX [phone] [email or web address]

## 2013-01-21 NOTE — Progress Notes (Signed)
Patient was given prescriptions for vicodin, dulera, florastor, and vancomycin to take to her pharmacy. Went over  discharge instructions with patient. She stated that she did not have any questions.

## 2013-01-21 NOTE — Progress Notes (Signed)
PT Cancellation Note  Patient Details Name: Tonya PughJeanette Chait MRN: [id number] DOB: 12/24/1940   Cancelled Treatment:    Reason Eval/Treat Not Completed: Patient declined, no reason specified.  Pt states she is going home today and doesn't need anymore PT.     Natoshia Souter, Alison MurrayMegan F 01/21/2013, 9:24 AM

## 2013-01-21 NOTE — Telephone Encounter (Signed)
Pt daughter called stating no pharmacy in [place] has the vancomycin in stock. Pharmacy has ordered it and will not be in until Monday afternoon. Is there something else patient can be given to take over the weekend until they get vancomycin on Monday?

## 2013-01-21 NOTE — Discharge Summary (Signed)
Physician Discharge Summary  Tonya Houston ZOX:[id number] DOB: 12/13/1940 DOA: 01/12/2013  PCP: Dan Maker, MD  Consultation: internal medicine  Admit date: 01/12/2013 Discharge date: 01/21/2013  Recommendations for Outpatient Follow-up:   Follow-up Information   Follow up with Wynona Luna., MD On 02/07/2013. (ARRIVE NO LATER THAN 9:30AM)    Specialty:  General Surgery   Contact information:   [address] [place] [address] [phone]       Follow up with Dan Maker, MD.   Specialty:  Internal Medicine   Contact information:   [address] [place] [address] [phone]      Discharge Diagnoses:  1. Incarcerated umbilical hernia with small bowel obstruction 2. Post operative c. Diff colitis 3. Diabetes mellitus 4. Hypertension 5. COPD   Surgical Procedure: Umbilical hernia repair 01/14/13, Dr. Manus Rudd.   Discharge Condition: stable Disposition: home  Diet recommendation: carb modified   Filed Weights   01/11/13 2251 01/12/13 0700 01/12/13 0829  Weight: 162 lb (73.483 kg) 163 lb 2.3 oz (74 kg) 162 lb (73.483 kg)    Filed Vitals:   01/21/13 0553  BP: 116/57  Pulse: 83  Temp: 97.4 F (36.3 C)  Resp: 17    Hospital Course:  Tonya Houston presented to [facility] on 01/12/13 with abdominal pain, nausea and vomiting.  She was found to have a incarcerated umbilical hernia with proximal small bowel obstruction.  She was admitted, NGT placed.  The hernia was reduced preoperatively.  Internal medicine was consulted for management of her chronic problems.  She underwent a hernia repair.  She was found to have c. Diff and started on flagyl and vancomycin.  She had an expected ileus which resolved.  Her vital signs remained stable.  CBGs monitored and supplemented with insulin.  Diarrhea improved. Diet was advanced as tolerated. On POD #7 the patient was ambulating, tolerating a diet, VSS, pain well controlled.  She was  therefore felt stable for discharge.  i made her a follow up appointment.  We discussed warning signs that warrant immediate attention.  We discussed enteric precautions, completing her antibiotics.    Physical Exam: General appearance: alert, cooperative and no distress  Resp: clear to auscultation bilaterally  Cardio: regular rate and rhythm, S1, S2 normal, no murmur, click, rub or gallop  GI: +bs, abdomen is soft, obese. umbilical incision is c/d/i with mild erythema inferior to the wound. appropriately tender.  Ext: trace BLE edema  Discharge Instructions   Future Appointments Provider Department Dept Phone   02/07/2013 9:40 AM [name]. Tsuei, MD [facility], [place] [phone]       Medication List    STOP taking these medications       gemfibrozil 600 MG tablet  Commonly known as:  LOPID     metFORMIN 1000 MG tablet  Commonly known as:  GLUCOPHAGE     omeprazole 20 MG capsule  Commonly known as:  PRILOSEC     simvastatin 20 MG tablet  Commonly known as:  ZOCOR      TAKE these medications       acetaminophen 325 MG tablet  Commonly known as:  TYLENOL  Take 2 tablets (650 mg total) by mouth every 6 (six) hours as needed for mild pain (or Temp > 100).     albuterol 108 (90 BASE) MCG/ACT inhaler  Commonly known as:  PROVENTIL HFA;VENTOLIN HFA  Inhale 2 puffs into the lungs every 6 (six) hours as needed for  wheezing or shortness of breath.     aspirin EC 81 MG tablet  Take 81 mg by mouth daily.     CALCIUM PO  Take 1 tablet by mouth daily.     Fluticasone-Salmeterol 250-50 MCG/DOSE Aepb  Commonly known as:  ADVAIR  Inhale 1 puff into the lungs 2 (two) times daily.     glimepiride 2 MG tablet  Commonly known as:  AMARYL  Take 1 tablet (2 mg total) by mouth daily with breakfast.     HYDROcodone-acetaminophen 5-325 MG per tablet  Commonly known as:  NORCO/VICODIN  Take 1 tablet by mouth every 6 (six) hours as needed for moderate pain.     IRON PO   Take 1 tablet by mouth daily.     levocetirizine 5 MG tablet  Commonly known as:  XYZAL  Take 5 mg by mouth every evening.     losartan 25 MG tablet  Commonly known as:  COZAAR  Take 25 mg by mouth daily.     mometasone-formoterol 100-5 MCG/ACT Aero  Commonly known as:  DULERA  Inhale 2 puffs into the lungs 2 (two) times daily.     montelukast 10 MG tablet  Commonly known as:  SINGULAIR  Take 10 mg by mouth at bedtime.     saccharomyces boulardii 250 MG capsule  Commonly known as:  FLORASTOR  Take 1 capsule (250 mg total) by mouth 2 (two) times daily.     vancomycin 50 mg/mL oral solution  Commonly known as:  VANCOCIN  Take 2.5 mLs (125 mg total) by mouth every 6 (six) hours.     VITAMIN B 12 PO  Take 500 mcg by mouth daily.           Follow-up Information   Follow up with Wynona LunaSUEI,MATTHEW K., MD On 02/07/2013. (ARRIVE NO LATER THAN 9:30AM)    Specialty:  General Surgery   Contact information:   [address] [place] [address] [phone]       Follow up with Dan MakerR,RICHARD L, MD.   Specialty:  Internal Medicine   Contact information:   [address] [place] [address] [phone]        The results of significant diagnostics from this hospitalization (including imaging, microbiology, ancillary and laboratory) are listed below for reference.    Significant Diagnostic Studies: Ct Abdomen Pelvis W Contrast  01/12/2013   CLINICAL DATA:  Abdominal pain since Monday.  EXAM: CT ABDOMEN AND PELVIS WITH CONTRAST  TECHNIQUE: Multidetector CT imaging of the abdomen and pelvis was performed using the standard protocol following bolus administration of intravenous contrast.  CONTRAST:  100mL OMNIPAQUE IOHEXOL 300 MG/ML  SOLN  COMPARISON:  None.  FINDINGS: Mild scarring in the anterior right lung. Small esophageal hiatal hernia.  Small amount of free fluid around the liver edge. No focal liver lesion. Surgical absence of the gallbladder.  The pancreas, spleen, adrenal glands, kidneys, inferior vena cava, and retroperitoneal lymph nodes are unremarkable. Calcification of the aorta without aneurysm. The stomach is not distended. There is small bowel distention with multiple fluid-filled loops. There is an umbilical hernia which contains a loop of small bowel. The small bowel distal to the hernia is decompressed. The hernia serves as the site of obstruction. No free air or free fluid in the abdomen.  Pelvis: Decompressed stool-filled colon. Scattered diverticula. No diverticulitis. The appendix is normal. No free or loculated pelvic fluid collections. The uterus is likely surgically absent. No abnormal  adnexal masses. No destructive bone lesions.  IMPRESSION: Umbilical hernia containing small bowel with small bowel obstruction proximal to the hernia. Small esophageal hiatal hernia. Small amount of free fluid around the liver edge.   Electronically Signed   By: Burman Nieves M.D.   On: 01/12/2013 02:23   Dg Chest Portable 1 View  01/12/2013   CLINICAL DATA:  Abdominal pain.  EXAM: PORTABLE CHEST - 1 VIEW  COMPARISON:  02/13/2012  FINDINGS: The heart size and mediastinal contours are within normal limits. Both lungs are clear. The visualized skeletal structures are unremarkable. No free air under the hemidiaphragms is demonstrated on semi-erect view.  IMPRESSION: No active disease.   Electronically Signed   By: Burman Nieves M.D.   On: 01/12/2013 01:27   Dg Abd 2 Views  01/17/2013   CLINICAL DATA:  Small bowel obstruction  EXAM: ABDOMEN - 2 VIEW  COMPARISON:  01/15/2013  FINDINGS: There is less distention of the small bowel than before. Small bowel diameter is now 4.3 cm, previously 6 cm. Air is present in the transverse colon and the descending colon. Scattered small bowel air-fluid levels on the upright exam. No definite free air. Lung bases appear clear.  IMPRESSION: Slight improvement in the small bowel obstruction pattern.    Electronically Signed   By: Ruel Favors M.D.   On: 01/17/2013 14:21   Dg Abd 2 Views  01/15/2013   CLINICAL DATA:  Postop abdominal distention with surgery to repair abdominal hernia performed yesterday  EXAM: ABDOMEN - 2 VIEW  COMPARISON:  CT scan 01/12/2013  FINDINGS: There is diffuse small bowel distension of moderate severity to a diameter of 6 cm. There is minimal gas into colon. There are air-fluid levels throughout the small bowel. No free air is identified on decubitus films.  IMPRESSION: Findings consistent with small-bowel obstruction.   Electronically Signed   By: Esperanza Heir M.D.   On: 01/15/2013 13:57   Dg Abd Portable 1v  01/12/2013   CLINICAL DATA:  Abdominal pain.  EXAM: PORTABLE ABDOMEN - 1 VIEW  COMPARISON:  None.  FINDINGS: Limited decubitus view is obtained. The nondependent portion of the right abdomen is not included within the field of view. No free air is demonstrated. However, small amount of free air could be missed off the field of view. There is evidence of mild gaseous distention probably of small bowel with multiple air-fluid levels and string of beads sign. Changes suggest dilated fluid-filled small bowel. Obstruction should be excluded.  IMPRESSION: Abnormal bowel gas pattern with suggestion of fluid-filled distended small bowel. Obstruction should be excluded. No evidence of free air although portions of the abdomen are not included within the field of view for evaluation.   Electronically Signed   By: Burman Nieves M.D.   On: 01/12/2013 01:29    Microbiology: Recent Results (from the past 240 hour(s))  SURGICAL PCR SCREEN     Status: None   Collection Time    01/13/13 12:22 AM      Result Value Range Status   MRSA, PCR NEGATIVE  NEGATIVE Final   Staphylococcus aureus NEGATIVE  NEGATIVE Final   Comment:            The Xpert SA Assay (FDA     approved for NASAL specimens     in patients over 87 years of age),     is one component of     a comprehensive  surveillance     program.  Test performance  has     been validated by [employer] for patients greater     than or equal to 10 year old.     It is not intended     to diagnose infection nor to     guide or monitor treatment.  CLOSTRIDIUM DIFFICILE BY PCR     Status: Abnormal   Collection Time    01/15/13  2:45 PM      Result Value Range Status   C difficile by pcr POSITIVE (*) NEGATIVE Final   Comment: CRITICAL RESULT CALLED TO, READ BACK BY AND VERIFIED WITH:     CAIN,I RN 01/15/13 1745 WOOTEN,K     Labs: Basic Metabolic Panel:  Recent Labs Lab 01/15/13 1210 01/17/13 1030 01/19/13 0958  NA 136* 138 144  K 4.4 3.8 3.9  CL 101 103 107  CO2 27 23 25   GLUCOSE 93 93 123*  BUN 14 9 4*  CREATININE 0.53 0.44* 0.51  CALCIUM 8.5 8.5 8.1*  MG  --  1.5  --    Liver Function Tests: No results found for this basename: AST, ALT, ALKPHOS, BILITOT, PROT, ALBUMIN,  in the last 168 hours No results found for this basename: LIPASE, AMYLASE,  in the last 168 hours No results found for this basename: AMMONIA,  in the last 168 hours CBC:  Recent Labs Lab 01/15/13 1210 01/17/13 1030 01/19/13 0958  WBC 3.9* 5.3 6.5  HGB 11.3* 11.8* 12.2  HCT 36.0 37.1 37.4  MCV 85.5 84.7 83.3  PLT 272 341 419*   Cardiac Enzymes: No results found for this basename: CKTOTAL, CKMB, CKMBINDEX, TROPONINI,  in the last 168 hours BNP: BNP (last 3 results) No results found for this basename: PROBNP,  in the last 8760 hours CBG:  Recent Labs Lab 01/20/13 1559 01/20/13 2014 01/21/13 0009 01/21/13 0356 01/21/13 0836  GLUCAP 111* 138* 81 89 111*    Active Problems:   Incarcerated umbilical hernia   HTN (hypertension)   Chronic airway obstruction, not elsewhere classified   Type II or unspecified type diabetes mellitus without mention of complication, not stated as uncontrolled   Enteritis due to Clostridium difficile   Time coordinating discharge: <30 mins  Signed:  Milynn Quirion,  ANP-BC

## 2013-01-21 NOTE — Telephone Encounter (Signed)
Called multiple pharmacy including [facility] in on back order.  She did not do well with po flagyl, but im hoping that was due to her ileus which has now resolved.  i called in rx for the flagyl to at least get her by the weekend.  She will be able to start the vanc again on Monday when her pharmacy at [facility] receives it.  They do not have dificid.  It would likely be too expensive as well.

## 2013-01-31 ENCOUNTER — Telehealth (INDEPENDENT_AMBULATORY_CARE_PROVIDER_SITE_OTHER): Payer: Self-pay

## 2013-01-31 NOTE — Telephone Encounter (Signed)
Paged Emina and asked for about the antibiotics and she stated that if the patient is handling the Flagyl well , she does not need to be on the Vancomycin. But if her diarrhea comes back that we will need to call the pharmacy and see if they have the Vancomycin back in stock

## 2013-01-31 NOTE — Telephone Encounter (Signed)
Patient called to see if she should continue her antibiotics.  She is on Flagyl that was prescribed since the Vancomycin was on back order.  She has no fever.  Her bowels are moving ok.  She is eating ok.  I advised to continue the antibiotics since she has only a few days left.  She has follow up on 02/07/2013.  I told her Dr Corliss Skainssuei will call her if she needs to do anything different.

## 2013-02-07 ENCOUNTER — Encounter (INDEPENDENT_AMBULATORY_CARE_PROVIDER_SITE_OTHER): Payer: Self-pay | Admitting: Surgery

## 2013-02-07 ENCOUNTER — Ambulatory Visit (INDEPENDENT_AMBULATORY_CARE_PROVIDER_SITE_OTHER): Payer: Medicare HMO | Admitting: Surgery

## 2013-02-07 VITALS — BP 118/64 | HR 100 | Temp 98.9°F | Resp 15 | Ht 62.5 in | Wt 163.2 lb

## 2013-02-07 DIAGNOSIS — K420 Umbilical hernia with obstruction, without gangrene: Secondary | ICD-10-CM

## 2013-02-07 NOTE — Progress Notes (Signed)
Status post open repair of an incarcerated umbilical hernia on 01/14/13. We did not use mesh as there was some ischemic omentum in the hernia. We did a suture repair with Novafil sutures. She developed C. Difficile and was treated with oral Flagyl. This has now resolved. She is feeling quite well. Appetite and bowel movements are normal. Her incision is well-healed with no sign of infection or recurrent hernia.  I explained to the patient again why we did not use mesh. She is at high risk for recurrence because of her obesity as well as the suture repair. If she develops recurrence I encouraged her to come see us sooner rather than later for an elective repair with mesh.  Followup when necessary.  Wilmon ArmsMatthew K. Corliss Skainssuei, MD, [facility]  General/ Trauma Surgery  02/07/2013 10:05 AM

## 2014-06-08 IMAGING — CR DG ABD PORTABLE 1V
1 series · 1 of 1 positions shown · non-contrast
Comparison: None.

CLINICAL DATA: Abdominal pain.

EXAM:
PORTABLE ABDOMEN - 1 VIEW

[pa lld]
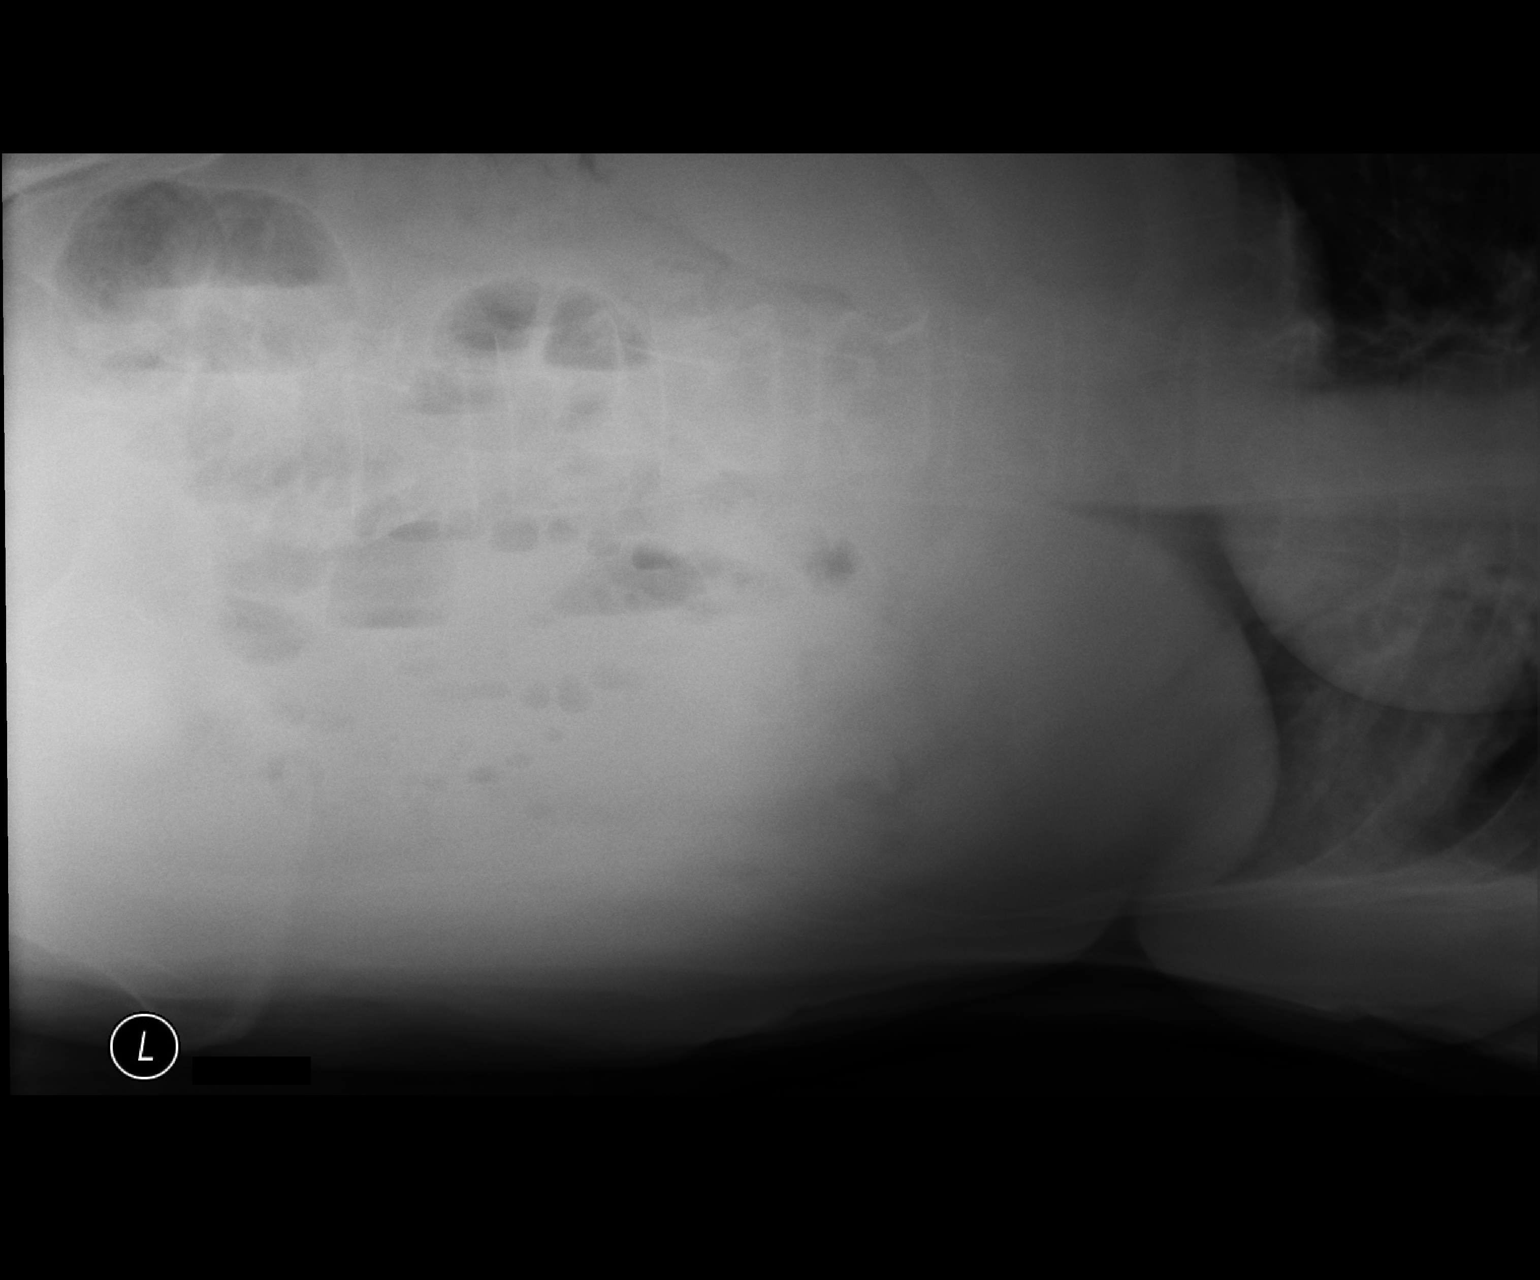

[1 of 1 positions shown; findings below may reference images not displayed]

FINDINGS: Limited decubitus view is obtained. The nondependent portion of the
right abdomen is not included within the field of view. No free air
is demonstrated. However, small amount of free air could be missed
off the field of view. There is evidence of mild gaseous distention
probably of small bowel with multiple air-fluid levels and string of
beads sign. Changes suggest dilated fluid-filled small bowel.
Obstruction should be excluded.
IMPRESSION: Abnormal bowel gas pattern with suggestion of fluid-filled distended
small bowel. Obstruction should be excluded. No evidence of free air
although portions of the abdomen are not included within the field
of view for evaluation.

## 2014-06-11 IMAGING — CR DG ABDOMEN 2V
3 series · 3 of 3 positions shown · non-contrast
Comparison: CT scan 01/12/2013

CLINICAL DATA: Postop abdominal distention with surgery to repair
abdominal hernia performed yesterday

EXAM:
ABDOMEN - 2 VIEW

[w abdomen decub (1 of 2)]
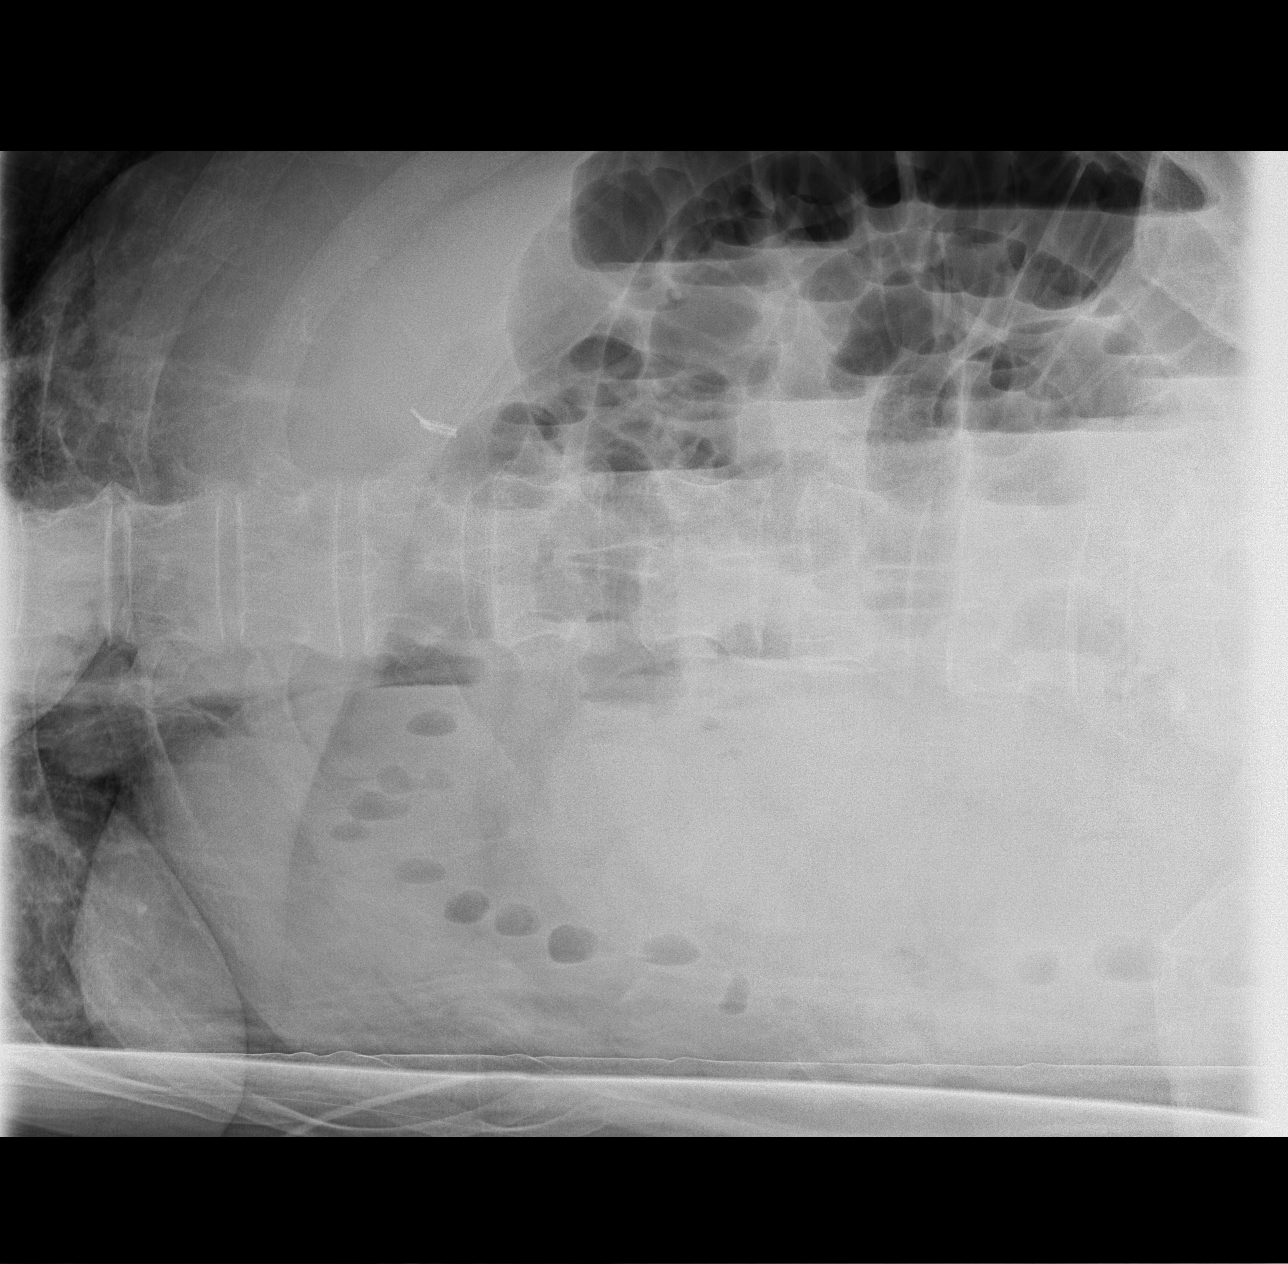

[w abdomen decub (2 of 2)]
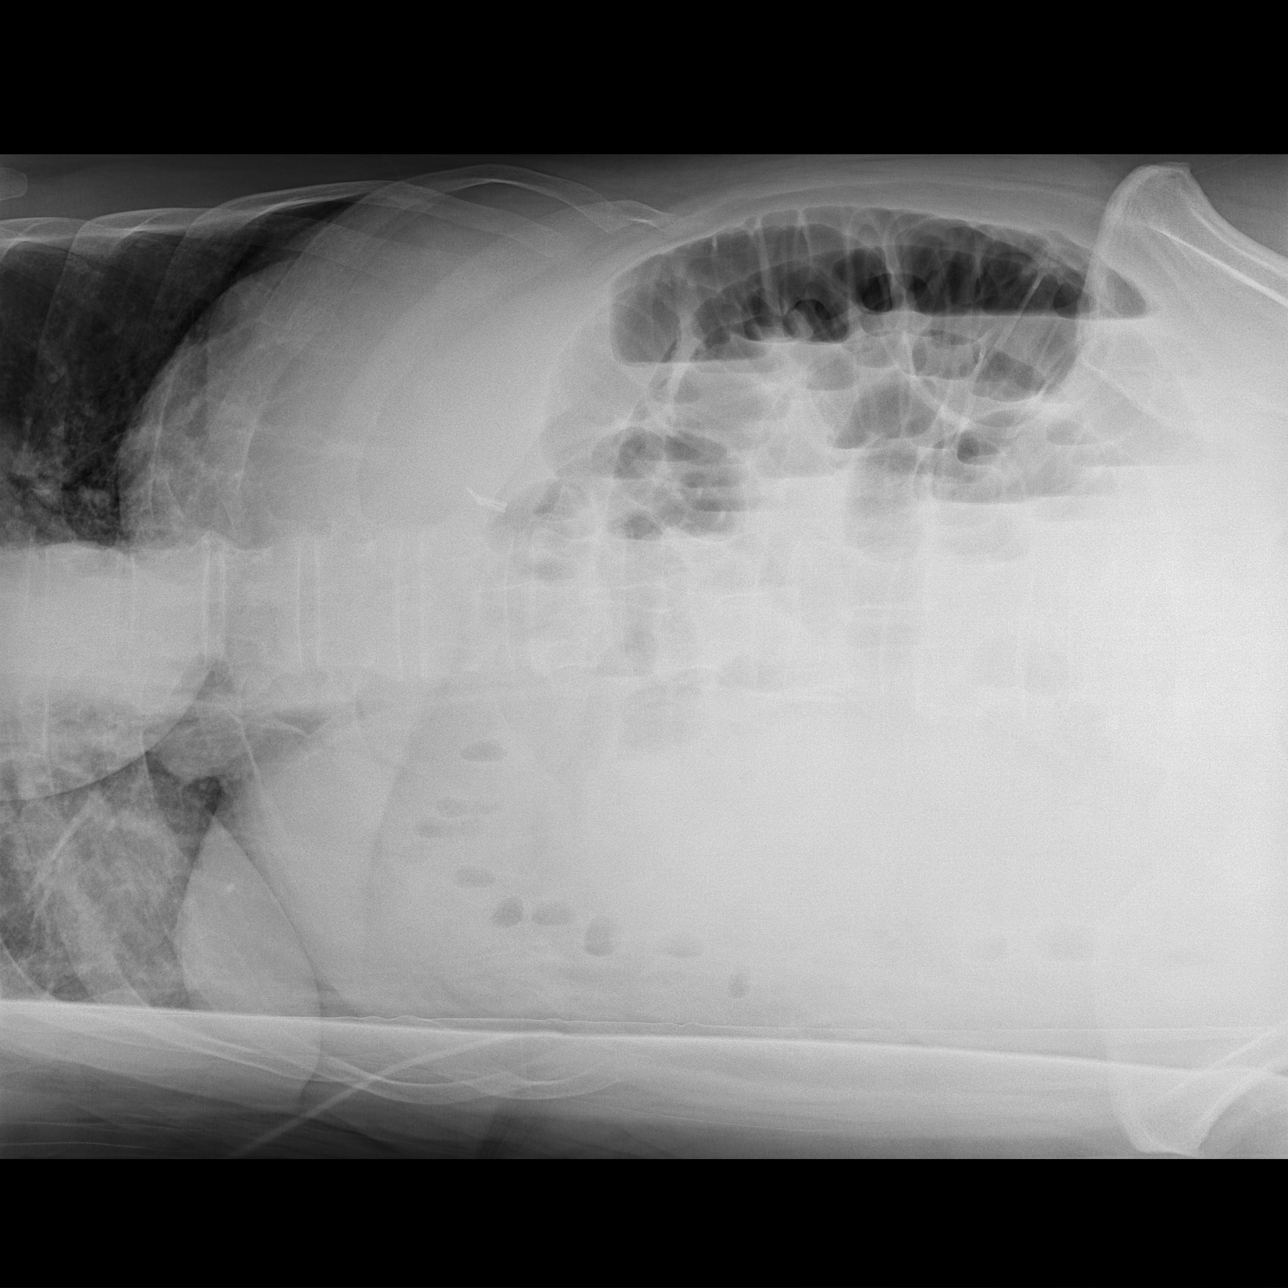

[x abdomen supine]
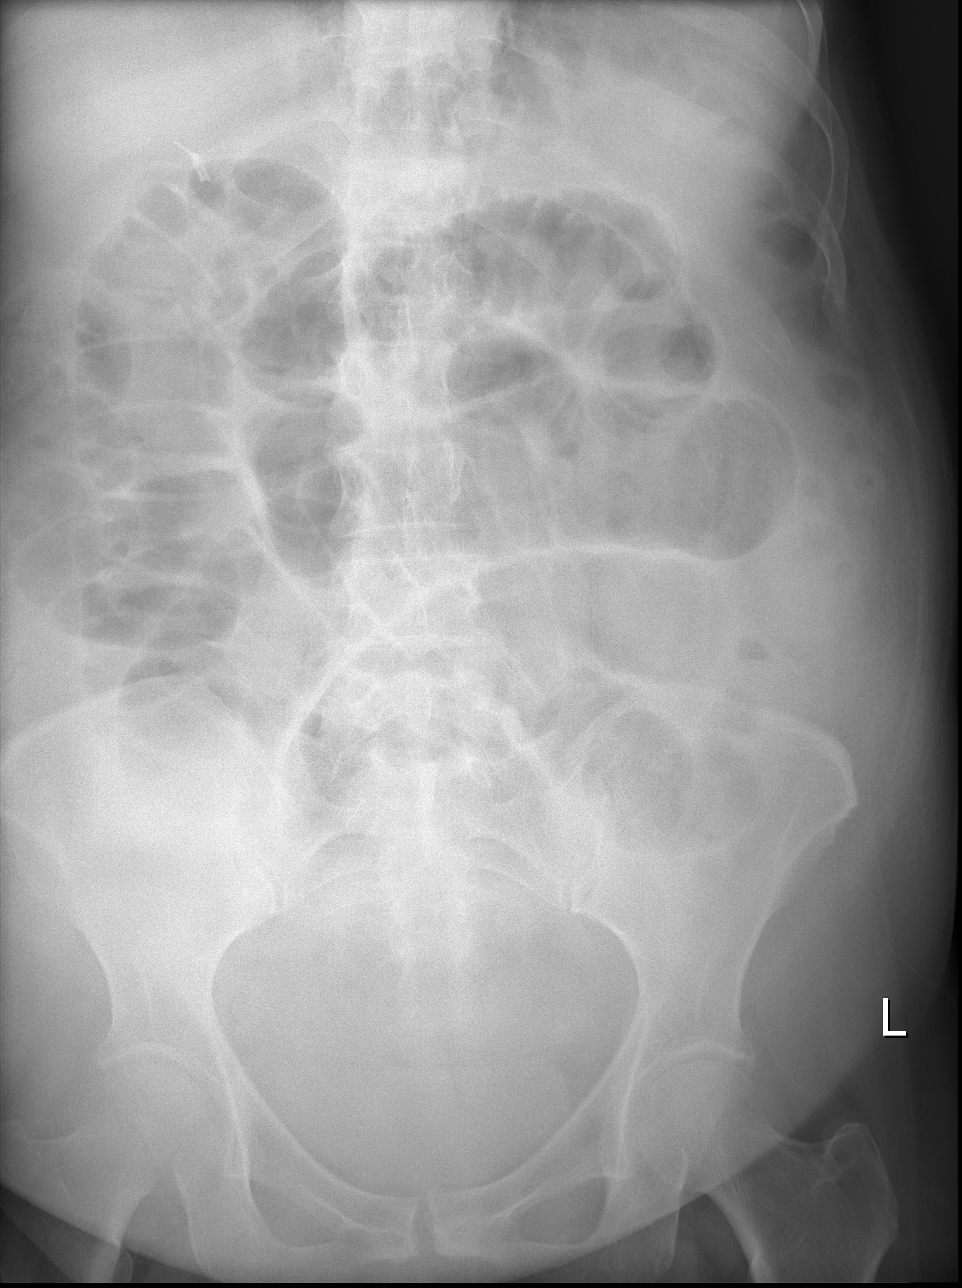

[3 of 3 positions shown; findings below may reference images not displayed]

FINDINGS: There is diffuse small bowel distension of moderate severity to a
diameter of 6 cm. There is minimal gas into colon. There are
air-fluid levels throughout the small bowel. No free air is
identified on decubitus films.
IMPRESSION: Findings consistent with small-bowel obstruction.

## 2014-06-13 IMAGING — CR DG ABDOMEN 2V
2 series · 2 of 2 positions shown · non-contrast
Comparison: 01/15/2013

CLINICAL DATA: Small bowel obstruction

EXAM:
ABDOMEN - 2 VIEW

[w abdomen upright]
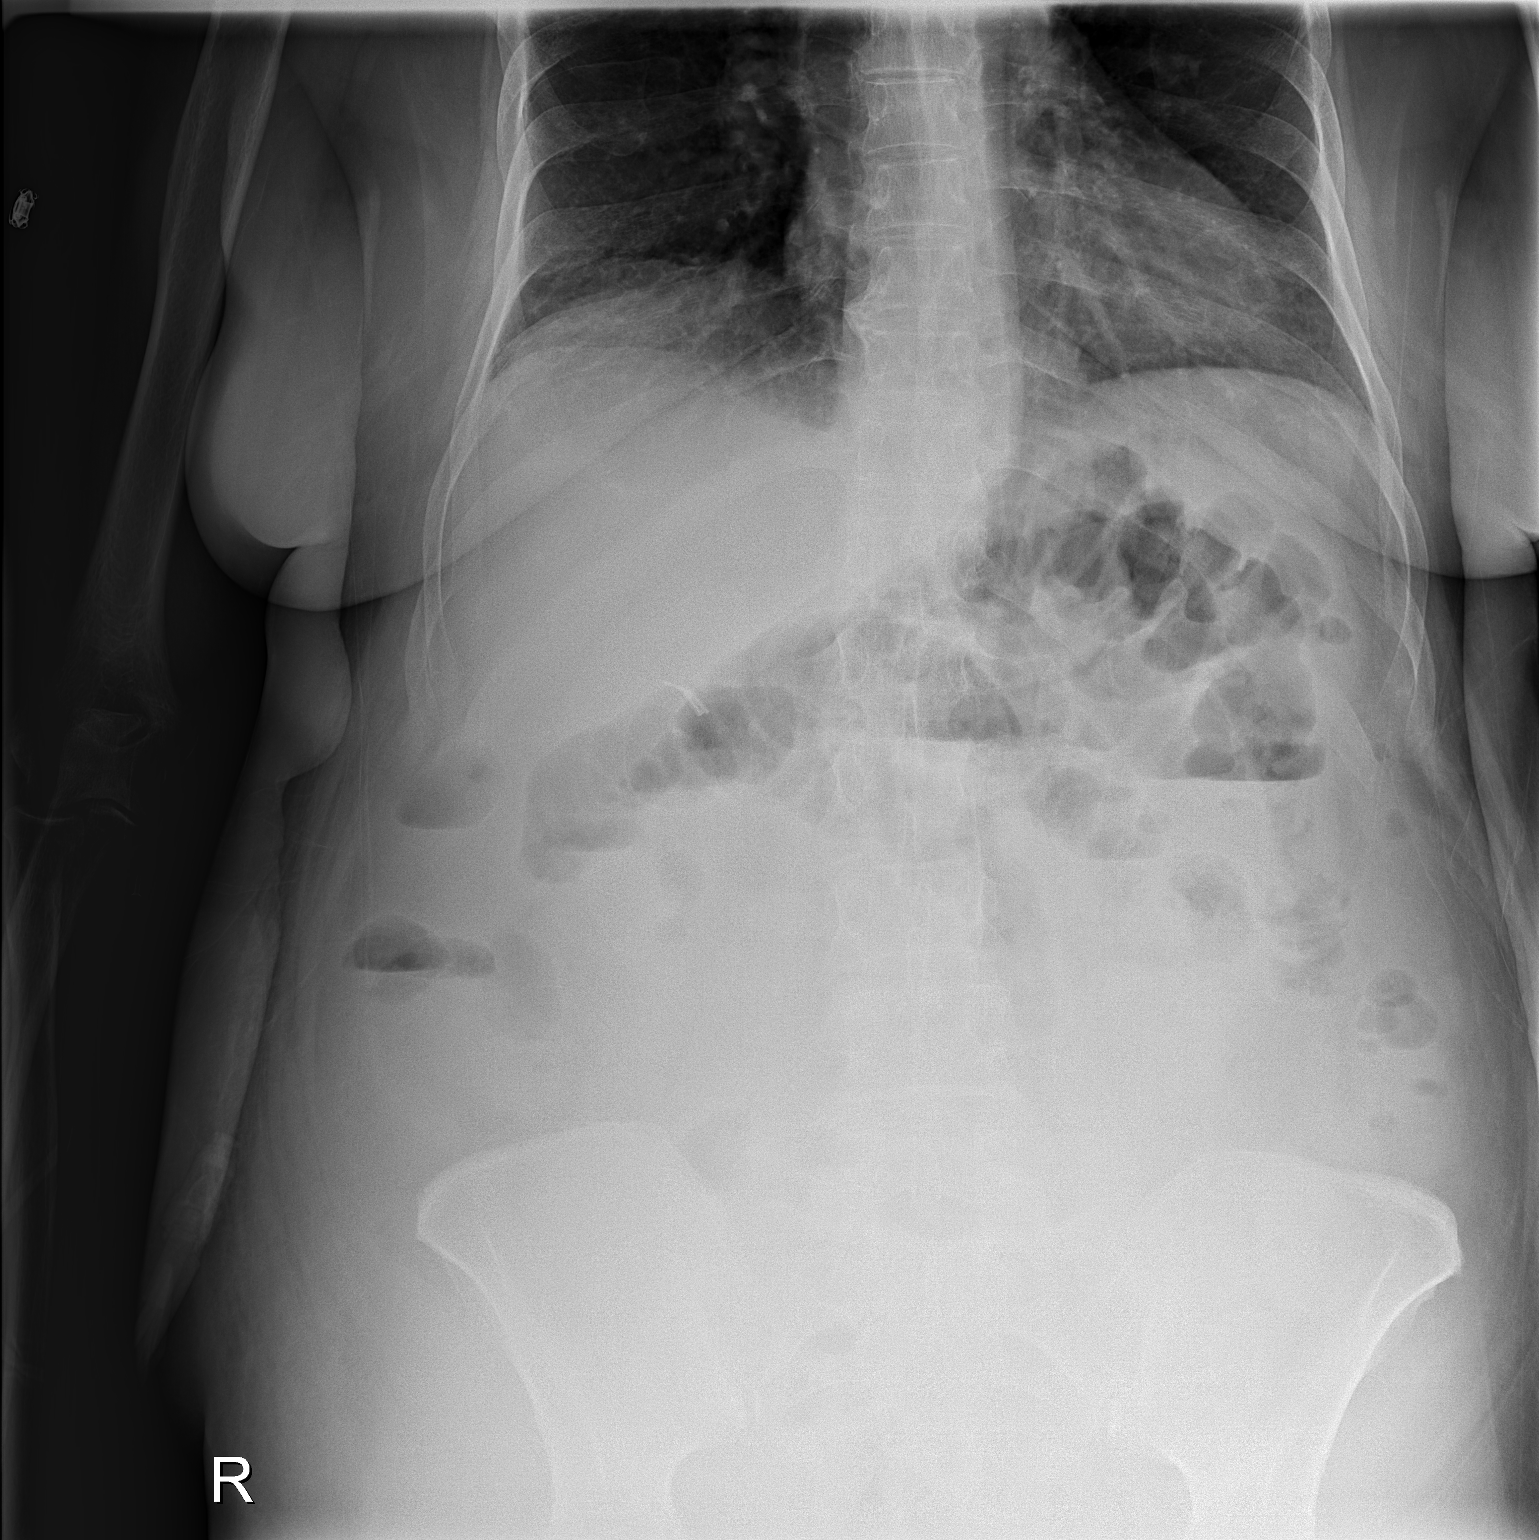

[t abdomen supine]
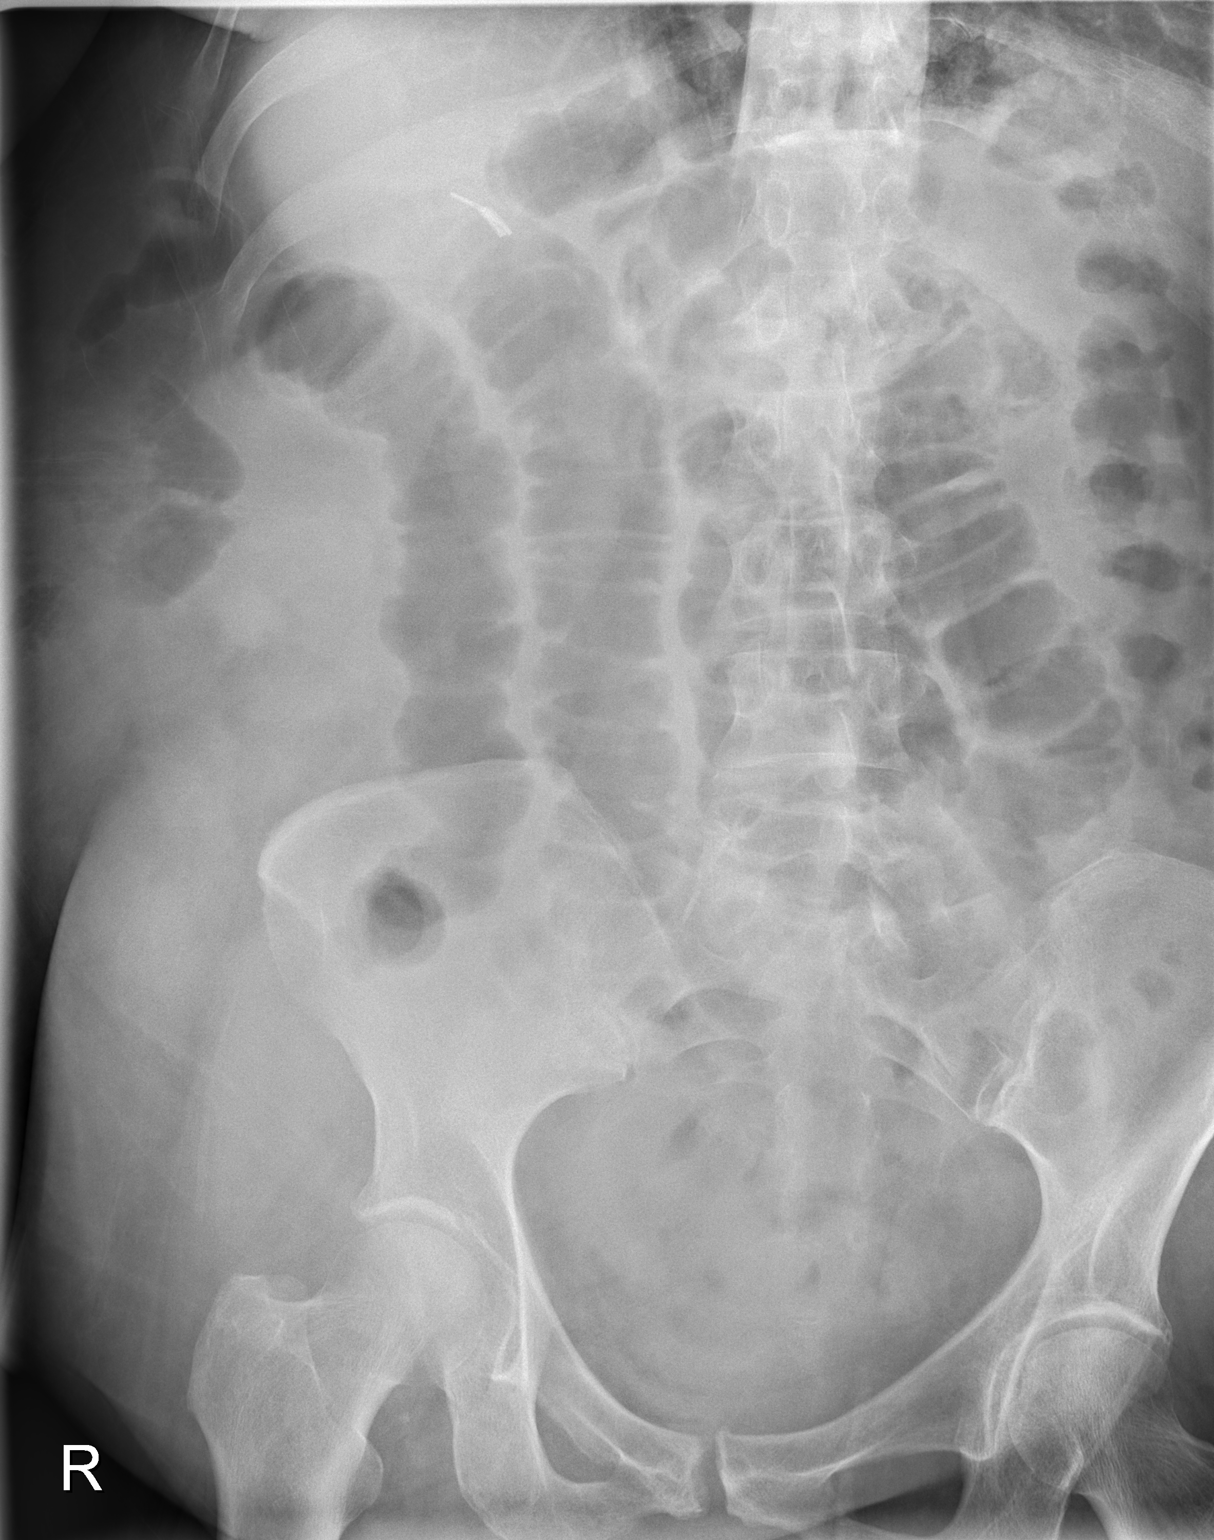

[2 of 2 positions shown; findings below may reference images not displayed]

FINDINGS: There is less distention of the small bowel than before. Small bowel
diameter is now 4.3 cm, previously 6 cm. Air is present in the
transverse colon and the descending colon. Scattered small bowel
air-fluid levels on the upright exam. No definite free air. Lung
bases appear clear.
IMPRESSION: Slight improvement in the small bowel obstruction pattern.
